# Patient Record
Sex: Male | Born: 1960 | Race: White | Hispanic: No | Marital: Married | Smoking: Former smoker
Health system: Southern US, Community
[De-identification: ages and names within clinical notes are randomized; demographics above are authoritative.]

## PROBLEM LIST (undated history)

## (undated) DIAGNOSIS — Z87828 Personal history of other (healed) physical injury and trauma: Secondary | ICD-10-CM

## (undated) DIAGNOSIS — K589 Irritable bowel syndrome without diarrhea: Secondary | ICD-10-CM

## (undated) DIAGNOSIS — K635 Polyp of colon: Secondary | ICD-10-CM

## (undated) DIAGNOSIS — K2270 Barrett's esophagus without dysplasia: Secondary | ICD-10-CM

## (undated) HISTORY — PX: ANTERIOR CRUCIATE LIGAMENT REPAIR: SHX115

## (undated) HISTORY — PX: VASECTOMY: SHX75

## (undated) HISTORY — PX: FRACTURE SURGERY: SHX138

## (undated) HISTORY — PX: CERVICAL DISCECTOMY: SHX98

## (undated) HISTORY — DX: Personal history of other (healed) physical injury and trauma: Z87.828

## (undated) HISTORY — DX: Barrett's esophagus without dysplasia: K22.70

## (undated) HISTORY — PX: SHOULDER SURGERY: SHX246

## (undated) HISTORY — DX: Polyp of colon: K63.5

## (undated) HISTORY — DX: Irritable bowel syndrome, unspecified: K58.9

---

## 1996-05-24 DIAGNOSIS — Z87828 Personal history of other (healed) physical injury and trauma: Secondary | ICD-10-CM

## 1996-05-24 HISTORY — DX: Personal history of other (healed) physical injury and trauma: Z87.828

## 2010-03-25 ENCOUNTER — Emergency Department (HOSPITAL_BASED_OUTPATIENT_CLINIC_OR_DEPARTMENT_OTHER): Admission: EM | Admit: 2010-03-25 | Discharge: 2010-03-25 | Payer: Self-pay | Admitting: Emergency Medicine

## 2010-03-25 ENCOUNTER — Ambulatory Visit: Payer: Self-pay | Admitting: Radiology

## 2010-08-04 LAB — POCT CARDIAC MARKERS

## 2010-08-04 LAB — CBC
MCH: 31.6 pg (ref 26.0–34.0)
MCV: 90.6 fL (ref 78.0–100.0)
Platelets: 253 10*3/uL (ref 150–400)
RDW: 11.4 % — ABNORMAL LOW (ref 11.5–15.5)

## 2010-08-04 LAB — BASIC METABOLIC PANEL
BUN: 19 mg/dL (ref 6–23)
CO2: 23 mEq/L (ref 19–32)
Chloride: 105 mEq/L (ref 96–112)
Creatinine, Ser: 1.2 mg/dL (ref 0.4–1.5)

## 2010-08-04 LAB — DIFFERENTIAL
Basophils Absolute: 0.1 10*3/uL (ref 0.0–0.1)
Basophils Relative: 1 % (ref 0–1)
Eosinophils Absolute: 0.1 10*3/uL (ref 0.0–0.7)
Eosinophils Relative: 2 % (ref 0–5)

## 2011-06-16 ENCOUNTER — Encounter: Payer: Self-pay | Admitting: Family Medicine

## 2011-06-16 DIAGNOSIS — K635 Polyp of colon: Secondary | ICD-10-CM | POA: Insufficient documentation

## 2011-06-16 DIAGNOSIS — K2270 Barrett's esophagus without dysplasia: Secondary | ICD-10-CM | POA: Insufficient documentation

## 2011-06-16 DIAGNOSIS — K589 Irritable bowel syndrome without diarrhea: Secondary | ICD-10-CM | POA: Insufficient documentation

## 2011-06-23 ENCOUNTER — Encounter: Payer: BC Managed Care – PPO | Admitting: Physician Assistant

## 2011-07-14 ENCOUNTER — Encounter: Payer: BC Managed Care – PPO | Admitting: Physician Assistant

## 2011-07-28 ENCOUNTER — Ambulatory Visit (INDEPENDENT_AMBULATORY_CARE_PROVIDER_SITE_OTHER): Payer: BC Managed Care – PPO | Admitting: Physician Assistant

## 2011-07-28 ENCOUNTER — Encounter: Payer: Self-pay | Admitting: Physician Assistant

## 2011-07-28 DIAGNOSIS — R202 Paresthesia of skin: Secondary | ICD-10-CM

## 2011-07-28 DIAGNOSIS — R635 Abnormal weight gain: Secondary | ICD-10-CM

## 2011-07-28 DIAGNOSIS — R5383 Other fatigue: Secondary | ICD-10-CM

## 2011-07-28 DIAGNOSIS — Z0000 Encounter for general adult medical examination without abnormal findings: Secondary | ICD-10-CM

## 2011-07-28 DIAGNOSIS — R209 Unspecified disturbances of skin sensation: Secondary | ICD-10-CM

## 2011-07-28 LAB — POCT URINALYSIS DIPSTICK
Bilirubin, UA: NEGATIVE
Blood, UA: NEGATIVE
Glucose, UA: NEGATIVE
Ketones, UA: NEGATIVE
Spec Grav, UA: 1.02

## 2011-07-28 LAB — CBC WITH DIFFERENTIAL/PLATELET
Basophils Absolute: 0 10*3/uL (ref 0.0–0.1)
Basophils Relative: 1 % (ref 0–1)
Hemoglobin: 14.7 g/dL (ref 13.0–17.0)
MCHC: 34.4 g/dL (ref 30.0–36.0)
Monocytes Relative: 14 % — ABNORMAL HIGH (ref 3–12)
Neutro Abs: 3 10*3/uL (ref 1.7–7.7)
Neutrophils Relative %: 58 % (ref 43–77)
Platelets: 270 10*3/uL (ref 150–400)

## 2011-07-28 MED ORDER — OMEPRAZOLE 20 MG PO CPDR: 20.0000 mg | DELAYED_RELEASE_CAPSULE | Freq: Every day | ORAL | Status: DC

## 2011-07-28 MED ORDER — GLYCOPYRROLATE 2 MG PO TABS: 2.0000 mg | ORAL_TABLET | Freq: Two times a day (BID) | ORAL | Status: DC

## 2011-07-28 NOTE — Progress Notes (Signed)
  Subjective:    Patient ID: Joshua Moses, male    DOB: [birth year], 51 y.o.   MRN: [id number]  HPI  See scanned in PE form.  Only c/o is fatigue, weight gain (weight is unchanged on our scales since Oct 2011).  Also has daily paresthesia over his L lateral quadricep area and also pain.  Present X 6 months.  [place]  Review of Systems  All other systems reviewed and are negative.       Objective:   Physical Exam  Vitals reviewed. Constitutional: He is oriented to person, place, and time. He appears well-developed and well-nourished.  HENT:  Head: Normocephalic and atraumatic.  Right Ear: External ear normal.  Left Ear: External ear normal.  Mouth/Throat: Oropharynx is clear and moist.  Eyes: Conjunctivae and EOM are normal. Pupils are equal, round, and reactive to light.  Neck: Normal range of motion. No tracheal deviation present. No thyromegaly present.  Cardiovascular: Normal rate, regular rhythm, normal heart sounds and intact distal pulses.  Exam reveals no gallop and no friction rub.   No murmur heard. Pulmonary/Chest: Effort normal and breath sounds normal.  Abdominal: Bowel sounds are normal. He exhibits no distension and no mass. There is no tenderness. There is no rebound and no guarding.  Genitourinary: Rectum normal, prostate normal and penis normal. Guaiac negative stool. No penile tenderness.  Musculoskeletal: Normal range of motion. He exhibits tenderness (point tender over distal aspect of IT band L leg). He exhibits no edema.  Lymphadenopathy:    He has no cervical adenopathy.  Neurological: He is alert and oriented to person, place, and time. He has normal reflexes. He displays normal reflexes. No cranial nerve deficit. He exhibits normal muscle tone. Coordination normal.  Skin: Skin is warm and dry.  Psychiatric: He has a normal mood and affect.    Results for orders placed in visit on 07/28/11  IFOBT (OCCULT BLOOD)      Component Value Range   IFOBT Negative    POCT URINALYSIS DIPSTICK      Component Value Range   Color, UA yellow     Clarity, UA clear     Glucose, UA neg     Bilirubin, UA neg     Ketones, UA neg     Spec Grav, UA 1.020     Blood, UA neg     pH, UA 5.5     Protein, UA neg     Urobilinogen, UA 0.2     Nitrite, UA neg     Leukocytes, UA Negative             Assessment & Plan:  Fatigue/subjective weight gain (seems more consistent with redistribution of same weight to me).  Check labs.  Refill meds.

## 2011-07-28 NOTE — Patient Instructions (Signed)
Cut down on alcohol, late night snacks.  Increase exercise.  Illiotibial band exercises and handout.

## 2011-07-29 LAB — TESTOSTERONE, FREE, TOTAL, SHBG
Testosterone, Free: 96 pg/mL (ref 47.0–244.0)
Testosterone-% Free: 1.7 % (ref 1.6–2.9)

## 2011-07-29 LAB — COMPREHENSIVE METABOLIC PANEL
AST: 26 U/L (ref 0–37)
Albumin: 4.3 g/dL (ref 3.5–5.2)
BUN: 14 mg/dL (ref 6–23)
Calcium: 9.6 mg/dL (ref 8.4–10.5)
Chloride: 105 mEq/L (ref 96–112)
Glucose, Bld: 105 mg/dL — ABNORMAL HIGH (ref 70–99)
Potassium: 4 mEq/L (ref 3.5–5.3)

## 2011-07-29 LAB — LIPID PANEL
Cholesterol: 210 mg/dL — ABNORMAL HIGH (ref 0–200)
LDL Cholesterol: 146 mg/dL — ABNORMAL HIGH (ref 0–99)
Triglycerides: 65 mg/dL (ref <150)

## 2011-07-30 LAB — VITAMIN D 1,25 DIHYDROXY
Vitamin D 1, 25 (OH)2 Total: 32 pg/mL (ref 18–72)
Vitamin D3 1, 25 (OH)2: 32 pg/mL

## 2011-09-27 ENCOUNTER — Other Ambulatory Visit: Payer: Self-pay | Admitting: Physician Assistant

## 2011-09-28 ENCOUNTER — Telehealth: Payer: Self-pay

## 2011-09-28 NOTE — Telephone Encounter (Signed)
.  [facility] The patient's wife called regarding the Rx for glycopyrrolate 2mg .  The patient's wife stated that she had called two weeks ago and the medication had not been called into pharmacy.  The patient's wife stated that the pharmacy re-faxed the request yesterday and Mal is out of his medication completely.  Please call [phone].

## 2011-09-29 NOTE — Telephone Encounter (Signed)
LMOM THAT THEY SHOULD HAVE 6 REFILLS ON RX AT [facility] THAT WAS SENT IN ON THE 07/28/11

## 2011-10-13 ENCOUNTER — Ambulatory Visit (INDEPENDENT_AMBULATORY_CARE_PROVIDER_SITE_OTHER): Payer: BC Managed Care – PPO | Admitting: Family Medicine

## 2011-10-13 ENCOUNTER — Ambulatory Visit: Payer: BC Managed Care – PPO

## 2011-10-13 VITALS — BP 121/86 | HR 55 | Temp 97.9°F | Resp 16 | Ht 63.0 in | Wt 158.2 lb

## 2011-10-13 DIAGNOSIS — M25569 Pain in unspecified knee: Secondary | ICD-10-CM

## 2011-10-13 DIAGNOSIS — IMO0002 Reserved for concepts with insufficient information to code with codable children: Secondary | ICD-10-CM

## 2011-10-13 MED ORDER — MELOXICAM 15 MG PO TABS: 15.0000 mg | ORAL_TABLET | Freq: Every day | ORAL | Status: AC

## 2011-10-13 NOTE — Progress Notes (Signed)
  Subjective:    Patient ID: Joshua Moses, male    DOB: 10-Feb-1961, 51 y.o.   MRN: [id number]  HPI See last OV.  He was having some trouble with his Left lower leg/knee which seemed to be at the IT-band.  It seems worse the last 3 days or so and intermittently before that.  Difficult to bear weight on it and especially painful with any movement that causes torque forces on it.  He did mis-step this weekend when stepping off a stool but it didn't begin to hurt immediately after that.  He has a difficult time explaining or showing me exactly where it is hurting.  Describes it as a deep ache, often behind the kneecap, definitely worsened with any flexion or trying to stoop or turn; in those cases, it feels as though it is going to give way.  Review of Systems  All other systems reviewed and are negative.       Objective:   Physical Exam  Nursing note and vitals reviewed. Constitutional: He appears well-developed and well-nourished.  Musculoskeletal:       Left knee: He exhibits decreased range of motion (extension is normal, flexion is limited by 50%). He exhibits no swelling, no effusion, no ecchymosis, no deformity, no laceration, no erythema, normal alignment, no LCL laxity, normal patellar mobility, no bony tenderness, normal meniscus and no MCL laxity. no tenderness found. No medial joint line, no lateral joint line, no MCL, no LCL and no patellar tendon tenderness noted.   [facility] reading (PRIMARY) by  Dr. Audria Nine as NAD except mild degenerative changes.     Assessment & Plan:  L knee pain-Strain and arthritic changes.  If not well in 2 weeks will refer.  Use stretches given at last ov.  Relative rest.

## 2013-02-14 ENCOUNTER — Encounter: Payer: Self-pay | Admitting: Family Medicine

## 2013-02-14 ENCOUNTER — Ambulatory Visit (INDEPENDENT_AMBULATORY_CARE_PROVIDER_SITE_OTHER): Payer: BC Managed Care – PPO | Admitting: Family Medicine

## 2013-02-14 VITALS — BP 120/80 | HR 48 | Temp 97.8°F | Resp 16 | Ht 63.0 in | Wt 154.0 lb

## 2013-02-14 DIAGNOSIS — G8929 Other chronic pain: Secondary | ICD-10-CM

## 2013-02-14 DIAGNOSIS — M542 Cervicalgia: Secondary | ICD-10-CM

## 2013-02-14 DIAGNOSIS — M722 Plantar fascial fibromatosis: Secondary | ICD-10-CM

## 2013-02-14 DIAGNOSIS — Z1211 Encounter for screening for malignant neoplasm of colon: Secondary | ICD-10-CM

## 2013-02-14 MED ORDER — MELOXICAM 15 MG PO TABS: 15.0000 mg | ORAL_TABLET | Freq: Every day | ORAL | Status: DC

## 2013-02-14 NOTE — Patient Instructions (Addendum)
Ankle pain- I suspect you have plantar fasciitis. I have included some information about this condition. Meloxicam will help as well as ankle stretching exercises first thing in the morning before you get up. I have included some information about foods that have anti-inflammatory properties. If this does not improve, an xray of your foot may reveal a heel spur; foot specialist (podiatrist) can diagnose and treat this specific condition if it does not improve.  FLAX SEED OIL capsule- 1 gram capsules; take 1-2 capsules daily. Take this separate from other solid tablets and capsules.    Plantar Fasciitis Plantar fasciitis is a common condition that causes foot pain. It is soreness (inflammation) of the band of tough fibrous tissue on the bottom of the foot that runs from the heel bone (calcaneus) to the ball of the foot. The cause of this soreness may be from excessive standing, poor fitting shoes, running on hard surfaces, being overweight, having an abnormal walk, or overuse (this is common in runners) of the painful foot or feet. It is also common in aerobic exercise dancers and ballet dancers. SYMPTOMS  Most people with plantar fasciitis complain of:  Severe pain in the morning on the bottom of their foot especially when taking the first steps out of bed. This pain recedes after a few minutes of walking.  Severe pain is experienced also during walking following a long period of inactivity.  Pain is worse when walking barefoot or up stairs DIAGNOSIS   Your caregiver will diagnose this condition by examining and feeling your foot.  Special tests such as X-rays of your foot, are usually not needed. PREVENTION   Consult a sports medicine professional before beginning a new exercise program.  Walking programs offer a good workout. With walking there is a lower chance of overuse injuries common to runners. There is less impact and less jarring of the joints.  Begin all new exercise programs  slowly. If problems or pain develop, decrease the amount of time or distance until you are at a comfortable level.  Wear good shoes and replace them regularly.  Stretch your foot and the heel cords at the back of the ankle (Achilles tendon) both before and after exercise.  Run or exercise on even surfaces that are not hard. For example, asphalt is better than pavement.  Do not run barefoot on hard surfaces.  If using a treadmill, vary the incline.  Do not continue to workout if you have foot or joint problems. Seek professional help if they do not improve. HOME CARE INSTRUCTIONS   Avoid activities that cause you pain until you recover.  Use ice or cold packs on the problem or painful areas after working out.  Only take over-the-counter or prescription medicines for pain, discomfort, or fever as directed by your caregiver.  Soft shoe inserts or athletic shoes with air or gel sole cushions may be helpful.  If problems continue or become more severe, consult a sports medicine caregiver or your own health care provider. Cortisone is a potent anti-inflammatory medication that may be injected into the painful area. You can discuss this treatment with your caregiver. MAKE SURE YOU:   Understand these instructions.  Will watch your condition.  Will get help right away if you are not doing well or get worse. Document Released: 02/02/2001 Document Revised: 08/02/2011 Document Reviewed: 04/03/2008 [facility] Patient Information 2014 [place], [place].

## 2013-02-14 NOTE — Progress Notes (Signed)
S:  This 52 y.o. Cauc male has chronic joint pain and C-spine DDD. The latter is treated by a chiropractor; last visit was ~ 1 year ago. Joint pain increases with weather changes and excessive physical activity. Meloxicam has been very effective in the past. New sudden onset of R heel pain, worse in mornings and better as day progresses. He has chronic L heel pain d/t fracture x 3. Pt walks on concrete floors at work and wears walking shoes; has tried different inserts but nothing alleviates pain. Does not take time to stretch in bed before getting up. He had low Vit D level last year and is taking a supplement.  Pt denies fever/chills, fatigue, rashes, swollen joints, muscle weakness or abnormal weight loss.  Pt has chronic neck pain w/ known C-spine DDD. He sees a chiropractor prn (not recently). He has crepitus in neck w/ rotation. He denies numbness or weakness in arms but has increased discomfort when rasing arms above shoulder level or w/ prolonged use of arms.  Pt also has hx of "colitis" and had endoscopy/colonoscopy years ago; he is overdue for this procedure. He has no change in stools, n/v, hematochezia or abnormal weight loss. He notes lactose intolerance but does drink almond milk.   Patient Active Problem List   Diagnosis Date Noted  . IBS (irritable bowel syndrome)   . Colon polyps   . Esophagus, Barrett's    PMHx, Soc Hx and Fam Hx reviewed. Mother had Degenerative joint Dz but not sure if she had Osteoporosis.  ROS: As per HPI; otherwise noncontributory.  O: Filed Vitals:   02/14/13 1027  BP: 120/80  Pulse: 48  Temp: 97.8 F (36.6 C)  Resp: 16   GEN: In NAD; WN,WD. HENT: [place]/AT; EOMI w/ clear conj/sclerae. Wears corrective lenses. EACs/nose/oroph unremarkable. COR: RRR. LUNGS: Normal resp rate and effort. BACK: C-spine w/ decreased ROM w/ rotation and flexion/ extension. NT w/ palpation. SKIN: W&D; intact. No erythema, rashes, ecchymoses or lesions. MS: MAEs; no c/c/e.  R heel tender to touch but otherwise normal appearance. NEURO: A&O x 3; CNs intact. Sensory/ motor function normal. DTRs 2+/=. Nonfocal.  A/P: Plantar fasciitis- Heel stretches and continue to wear appropriate footwear. Consider podiatry evaluation.  Neck pain, chronic- Resume Meloxicam; try moist heat and topical analgesic. Advised pt about anti-inflammatory nutrition. Pt has generalized aches and pains- consider testing for Hep C antibody.  Screening for colorectal cancer - Plan: Ambulatory referral to Gastroenterology  Meds ordered this encounter  Medications  . DISCONTD: meloxicam (MOBIC) 15 MG tablet    Sig: Take 15 mg by mouth daily.  . meloxicam (MOBIC) 15 MG tablet    Sig: Take 1 tablet (15 mg total) by mouth daily.    Dispense:  30 tablet    Refill:  5

## 2013-02-15 ENCOUNTER — Encounter: Payer: Self-pay | Admitting: Family Medicine

## 2013-02-15 DIAGNOSIS — G8929 Other chronic pain: Secondary | ICD-10-CM | POA: Insufficient documentation

## 2013-08-14 ENCOUNTER — Encounter: Payer: Self-pay | Admitting: Family Medicine

## 2013-08-14 ENCOUNTER — Ambulatory Visit (INDEPENDENT_AMBULATORY_CARE_PROVIDER_SITE_OTHER): Payer: BC Managed Care – PPO | Admitting: Family Medicine

## 2013-08-14 VITALS — BP 140/82 | HR 54 | Temp 98.8°F | Resp 16 | Ht 63.0 in | Wt 164.4 lb

## 2013-08-14 DIAGNOSIS — R635 Abnormal weight gain: Secondary | ICD-10-CM

## 2013-08-14 DIAGNOSIS — Z8639 Personal history of other endocrine, nutritional and metabolic disease: Secondary | ICD-10-CM

## 2013-08-14 DIAGNOSIS — Z0000 Encounter for general adult medical examination without abnormal findings: Secondary | ICD-10-CM

## 2013-08-14 DIAGNOSIS — M25579 Pain in unspecified ankle and joints of unspecified foot: Secondary | ICD-10-CM | POA: Insufficient documentation

## 2013-08-14 LAB — IFOBT (OCCULT BLOOD): IFOBT: NEGATIVE

## 2013-08-14 MED ORDER — ACETAMINOPHEN-CODEINE #2 300-15 MG PO TABS: 1.0000 | ORAL_TABLET | ORAL | Status: DC | PRN

## 2013-08-14 MED ORDER — MELOXICAM 15 MG PO TABS: 15.0000 mg | ORAL_TABLET | Freq: Every day | ORAL | Status: DC

## 2013-08-14 MED ORDER — OMEPRAZOLE 20 MG PO CPDR: 20.0000 mg | DELAYED_RELEASE_CAPSULE | Freq: Every day | ORAL | Status: DC

## 2013-08-14 NOTE — Patient Instructions (Addendum)
Keeping you healthy  Get these tests  Blood pressure- Have your blood pressure checked once a year by your healthcare provider.  Normal blood pressure is 120/80  Weight- Have your body mass index (BMI) calculated to screen for obesity.  BMI is a measure of body fat based on height and weight. You can also calculate your own BMI at [email or web address].  Cholesterol- Have your cholesterol checked every year.  Diabetes- Have your blood sugar checked regularly if you have high blood pressure, high cholesterol, have a family history of diabetes or if you are overweight.  Screening for Colon Cancer- Colonoscopy starting at age 55.  Screening may begin sooner depending on your family history and other health conditions. Follow up colonoscopy as directed by your Gastroenterologist.  Screening for Prostate Cancer- Both blood work (PSA) and a rectal exam help screen for Prostate Cancer.  Screening begins at age 55 with African-American men and at age 55 with Caucasian men.  Screening may begin sooner depending on your family history.  Take these medicines  Aspirin- One aspirin daily can help prevent Heart disease and Stroke.  Flu shot- Every fall.  Tetanus- Every 10 years. Tdap was given on 04/30/2009. Next Tetanus due in 2020.  Zostavax- Once after the age of 55 to prevent Shingles.  Pneumonia shot- Once after the age of 55; if you are younger than 55, ask your healthcare provider if you need a Pneumonia shot.  Take these steps  Don't smoke- If you do smoke, talk to your doctor about quitting.  For tips on how to quit, go to www.smokefree.gov or call 1-800-QUIT-NOW.  Be physically active- Exercise 5 days a week for at least 30 minutes.  If you are not already physically active start slow and gradually work up to 30 minutes of moderate physical activity.  Examples of moderate activity include walking briskly, mowing the yard, dancing, swimming, bicycling, etc.  Eat a healthy diet- Eat a  variety of healthy food such as fruits, vegetables, low fat milk, low fat cheese, yogurt, lean meant, poultry, fish, beans, tofu, etc. For more information go to www.thenutritionsource.org  Drink alcohol in moderation- Limit alcohol intake to less than two drinks a day. Never drink and drive.  Dentist- Brush and floss twice daily; visit your dentist twice a year.  Depression- Your emotional health is as important as your physical health. If you're feeling down, or losing interest in things you would normally enjoy please talk to your healthcare provider.  Eye exam- Visit your eye doctor every year.  Safe sex- If you may be exposed to a sexually transmitted infection, use a condom.  Seat belts- Seat belts can save your life; always wear one.  Smoke/Carbon Monoxide detectors- These detectors need to be installed on the appropriate level of your home.  Replace batteries at least once a year.  Skin cancer- When out in the sun, cover up and use sunscreen 15 SPF or higher.  Violence- If anyone is threatening you, please tell your healthcare provider.  Living Will/ Health care power of attorney- Speak with your healthcare provider and family.  I have placed an order for referral to Podiatry. The specialist will evaluate your problem do xrays and prescribe treatment.    Gastroesophageal Reflux Disease, Adult Gastroesophageal reflux disease (GERD) happens when acid from your stomach flows up into the esophagus. When acid comes in contact with the esophagus, the acid causes soreness (inflammation) in the esophagus. Over time, GERD may create small holes (ulcers) in the  lining of the esophagus. CAUSES   Increased body weight. This puts pressure on the stomach, making acid rise from the stomach into the esophagus.  Smoking. This increases acid production in the stomach.  Drinking alcohol. This causes decreased pressure in the lower esophageal sphincter (valve or ring of muscle between the  esophagus and stomach), allowing acid from the stomach into the esophagus.  Late evening meals and a full stomach. This increases pressure and acid production in the stomach.  A malformed lower esophageal sphincter. Sometimes, no cause is found. SYMPTOMS   Burning pain in the lower part of the mid-chest behind the breastbone and in the mid-stomach area. This may occur twice a week or more often.  Trouble swallowing.  Sore throat.  Dry cough.  Asthma-like symptoms including chest tightness, shortness of breath, or wheezing. DIAGNOSIS  Your caregiver may be able to diagnose GERD based on your symptoms. In some cases, X-rays and other tests may be done to check for complications or to check the condition of your stomach and esophagus. TREATMENT  Your caregiver may recommend over-the-counter or prescription medicines to help decrease acid production. Ask your caregiver before starting or adding any new medicines.  HOME CARE INSTRUCTIONS   Change the factors that you can control. Ask your caregiver for guidance concerning weight loss, quitting smoking, and alcohol consumption.  Avoid foods and drinks that make your symptoms worse, such as:  Caffeine or alcoholic drinks.  Chocolate.  Peppermint or mint flavorings.  Garlic and onions.  Spicy foods.  Citrus fruits, such as oranges, lemons, or limes.  Tomato-based foods such as sauce, chili, salsa, and pizza.  Fried and fatty foods.  Avoid lying down for the 3 hours prior to your bedtime or prior to taking a nap.  Eat small, frequent meals instead of large meals.  Wear loose-fitting clothing. Do not wear anything tight around your waist that causes pressure on your stomach.  Raise the head of your bed 6 to 8 inches with wood blocks to help you sleep. Extra pillows will not help.  Only take over-the-counter or prescription medicines for pain, discomfort, or fever as directed by your caregiver.  Do not take aspirin,  ibuprofen, or other nonsteroidal anti-inflammatory drugs (NSAIDs). SEEK IMMEDIATE MEDICAL CARE IF:   You have pain in your arms, neck, jaw, teeth, or back.  Your pain increases or changes in intensity or duration.  You develop nausea, vomiting, or sweating (diaphoresis).  You develop shortness of breath, or you faint.  Your vomit is green, yellow, black, or looks like coffee grounds or blood.  Your stool is red, bloody, or black. These symptoms could be signs of other problems, such as heart disease, gastric bleeding, or esophageal bleeding. MAKE SURE YOU:   Understand these instructions.  Will watch your condition.  Will get help right away if you are not doing well or get worse. Document Released: 02/17/2005 Document Revised: 08/02/2011 Document Reviewed: 11/27/2010 [facility] Patient Information 2014 [place], [place].

## 2013-08-14 NOTE — Progress Notes (Signed)
Subjective:    Patient ID: Joshua Moses, male    DOB: November 14, 1960, 53 y.o.   MRN: [id number]  HPI  This 53 y.o. Cauc male is here for CPE; His main complaints are chronic daily ankle and foot pain and fatigue w/ weight gain. Pain not responding to changed footwear. Pt takes one of wife's APAP-Codeine #3; relief w/ 1/3- 1/2 tablet. Does have hx of L ankle fracture in remote past. Denies swelling or discoloration w/ pain.  HCM: CRS- GI eval in jan 2015.           IMM- Current.           Vision- Biannually.  Patient Active Problem List   Diagnosis Date Noted  . Pain in joint, ankle and foot 08/14/2013  . Neck pain, chronic 02/15/2013  . IBS (irritable bowel syndrome)   . Colon polyps   . Esophagus, Barrett's    Prior to Admission medications   Medication Sig Start Date End Date Taking? Authorizing Provider  meloxicam (MOBIC) 15 MG tablet Take 1 tablet (15 mg total) by mouth daily.   Yes Maurice MarchBarbara B Britteny Fiebelkorn, MD          PMHx, Surg Hx, Soc and Fam Hx reviewed.  Review of Systems  Constitutional: Positive for unexpected weight change.  Eyes: Positive for visual disturbance.  Musculoskeletal:       Joint and muscle pain      Objective:   Physical Exam  Nursing note and vitals reviewed. Constitutional: He is oriented to person, place, and time. Vital signs are normal. He appears well-developed and well-nourished.  HENT:  Head: Normocephalic and atraumatic.  Right Ear: Hearing, tympanic membrane, external ear and ear canal normal.  Left Ear: Hearing, tympanic membrane, external ear and ear canal normal.  Nose: Nose normal. No nasal deformity or septal deviation.  Mouth/Throat: Uvula is midline, oropharynx is clear and moist and mucous membranes are normal. No oral lesions. Normal dentition.  Eyes: Conjunctivae, EOM and lids are normal. Pupils are equal, round, and reactive to light. No scleral icterus.  Fundoscopic exam:      The right eye shows no papilledema. The right eye shows  red reflex.       The left eye shows no papilledema. The left eye shows red reflex.  Recent vision evaluation- no significant abnormalities.  Neck: Trachea normal, normal range of motion and full passive range of motion without pain. Neck supple. No JVD present. No spinous process tenderness and no muscular tenderness present. Carotid bruit is not present. No mass and no thyromegaly present.  Cardiovascular: Normal rate, regular rhythm, S1 normal, S2 normal, normal heart sounds, intact distal pulses and normal pulses.   No extrasystoles are present. PMI is not displaced.  Exam reveals no gallop and no friction rub.   No murmur heard. Pulmonary/Chest: Effort normal and breath sounds normal. No respiratory distress.  Abdominal: Soft. Normal appearance and bowel sounds are normal. He exhibits no distension, no abdominal bruit, no pulsatile midline mass and no mass. There is no hepatosplenomegaly. There is no tenderness. There is no guarding and no CVA tenderness. No hernia.  Genitourinary: Rectum normal and prostate normal. Rectal exam shows no external hemorrhoid, no fissure, no mass, no tenderness and anal tone normal. Guaiac negative stool.  Musculoskeletal:       Right ankle: He exhibits decreased range of motion. He exhibits no swelling and no deformity. Tenderness. Achilles tendon normal.       Left ankle: He exhibits  decreased range of motion and deformity. He exhibits no swelling. Tenderness. Achilles tendon exhibits pain and defect.       Cervical back: Normal.       Thoracic back: Normal.       Lumbar back: Normal.       Right foot: He exhibits tenderness. He exhibits normal range of motion, no swelling, no crepitus and no deformity.       Left foot: He exhibits decreased range of motion and tenderness. He exhibits no bony tenderness, no swelling and no crepitus.       Feet:  Lymphadenopathy:       Head (right side): No submental, no submandibular, no tonsillar, no preauricular, no  posterior auricular and no occipital adenopathy present.       Head (left side): No submental, no submandibular, no tonsillar, no preauricular and no posterior auricular adenopathy present.    He has no cervical adenopathy.       Right: No inguinal and no supraclavicular adenopathy present.       Left: No inguinal and no supraclavicular adenopathy present.  Neurological: He is alert and oriented to person, place, and time. He has normal strength and normal reflexes. He displays no atrophy. No cranial nerve deficit or sensory deficit. He exhibits normal muscle tone. He displays a negative Romberg sign. Coordination and gait normal.  Skin: Skin is warm, dry and intact. No bruising, no lesion and no rash noted. He is not diaphoretic. No cyanosis or erythema. No pallor. Nails show no clubbing.  Psychiatric: His speech is normal and behavior is normal. Judgment and thought content normal. His mood appears anxious. His affect is not angry and not inappropriate. Cognition and memory are normal. He does not exhibit a depressed mood.    Results for orders placed in visit on 08/14/13  IFOBT (OCCULT BLOOD)      Result Value Ref Range   IFOBT Negative     ECG:  NSR; Nonspecific inferior T wave changes. No ectopy.     Assessment & Plan:  Routine general medical examination at a health care facility - Plan: IFOBT POC (occult bld, rslt in office), COMPLETE METABOLIC PANEL WITH GFR, Lipid panel, PSA, Hepatitis C antibody, Vitamin D, 25-hydroxy, EKG 12-Lead, Thyroid Panel With TSH  Pain in joint, ankle and foot - Plan: Ambulatory referral to Podiatry  Weight gain- Encouraged TLCs (heart healthy diet and staying physically active).  History of vitamin D deficiency.  Meds ordered this encounter  Medications  . DISCONTD: meloxicam (MOBIC) 15 MG tablet    Sig: Take 1 tablet (15 mg total) by mouth daily.    Dispense:  30 tablet    Refill:  5  . DISCONTD: omeprazole (PRILOSEC) 20 MG capsule    Sig: Take 1  capsule (20 mg total) by mouth daily.    Dispense:  30 capsule    Refill:  5  . acetaminophen-codeine (TYLENOL #2) 300-15 MG per tablet    Sig: Take 1 tablet by mouth every 4 (four) hours as needed for moderate pain.    Dispense:  30 tablet    Refill:  0  . meloxicam (MOBIC) 15 MG tablet    Sig: Take 1 tablet (15 mg total) by mouth daily.    Dispense:  90 tablet    Refill:  1

## 2013-08-15 LAB — LIPID PANEL
CHOLESTEROL: 191 mg/dL (ref 0–200)
HDL: 57 mg/dL (ref >39)
LDL Cholesterol: 120 mg/dL — ABNORMAL HIGH (ref 0–99)
TRIGLYCERIDES: 69 mg/dL (ref <150)
Total CHOL/HDL Ratio: 3.4 Ratio
VLDL: 14 mg/dL (ref 0–40)

## 2013-08-15 LAB — COMPLETE METABOLIC PANEL WITH GFR
ALK PHOS: 67 U/L (ref 39–117)
ALT: 24 U/L (ref 0–53)
AST: 24 U/L (ref 0–37)
Albumin: 4.2 g/dL (ref 3.5–5.2)
BILIRUBIN TOTAL: 0.6 mg/dL (ref 0.2–1.2)
BUN: 11 mg/dL (ref 6–23)
CALCIUM: 9.3 mg/dL (ref 8.4–10.5)
CHLORIDE: 106 meq/L (ref 96–112)
CO2: 25 mEq/L (ref 19–32)
CREATININE: 0.99 mg/dL (ref 0.50–1.35)
GFR, Est African American: >89 mL/min
GFR, Est Non African American: 86 mL/min
Glucose, Bld: 99 mg/dL (ref 70–99)
Potassium: 3.9 mEq/L (ref 3.5–5.3)
Sodium: 137 mEq/L (ref 135–145)
Total Protein: 6.8 g/dL (ref 6.0–8.3)

## 2013-08-15 LAB — PSA: PSA: 1.25 ng/mL (ref <=4.00)

## 2013-08-15 LAB — THYROID PANEL WITH TSH
FREE THYROXINE INDEX: 3.1 (ref 1.0–3.9)
T3 Uptake: 40.6 % — ABNORMAL HIGH (ref 22.5–37.0)
T4, Total: 7.6 ug/dL (ref 5.0–12.5)
TSH: 1.327 u[IU]/mL (ref 0.350–4.500)

## 2013-08-15 LAB — VITAMIN D 25 HYDROXY (VIT D DEFICIENCY, FRACTURES): VIT D 25 HYDROXY: 31 ng/mL (ref 30–89)

## 2013-08-15 LAB — HEPATITIS C ANTIBODY: HCV Ab: NEGATIVE

## 2013-08-15 NOTE — Progress Notes (Signed)
Quick Note:  Please advise pt regarding following labs...  All labs look good except LDL ("bad") cholesterol is a little above normal. Try to eat as healthy as possible, using the guidelines I gave you. PSA (prostate blood test) is normal. Test for Hepatitis C is negative. Thyroid tests are normal.  Vitamin D is a little below normal. Get an over-the-counter supplement Vitamin D3 2000 units and take this supplement daily. Try to get some sun exposure for 10-15 minutes most days of the week so your body can use the Vitamin D. Foods that are rich in Vitamin D include: salmon, tuna and mackerel, mushrooms, eggs and some dairy products.  Copy to pt. ______

## 2013-08-16 ENCOUNTER — Encounter: Payer: BC Managed Care – PPO | Admitting: Family Medicine

## 2013-09-06 ENCOUNTER — Ambulatory Visit (INDEPENDENT_AMBULATORY_CARE_PROVIDER_SITE_OTHER): Payer: BC Managed Care – PPO | Admitting: Podiatry

## 2013-09-06 ENCOUNTER — Encounter: Payer: Self-pay | Admitting: Podiatry

## 2013-09-06 ENCOUNTER — Ambulatory Visit (INDEPENDENT_AMBULATORY_CARE_PROVIDER_SITE_OTHER): Payer: BC Managed Care – PPO

## 2013-09-06 DIAGNOSIS — M722 Plantar fascial fibromatosis: Secondary | ICD-10-CM

## 2013-09-06 DIAGNOSIS — R52 Pain, unspecified: Secondary | ICD-10-CM

## 2013-09-06 DIAGNOSIS — M7660 Achilles tendinitis, unspecified leg: Secondary | ICD-10-CM

## 2013-09-06 MED ORDER — TRIAMCINOLONE ACETONIDE 10 MG/ML IJ SUSP
10.0000 mg | Freq: Once | INTRAMUSCULAR | Status: AC
  Administered 2013-09-06: 10 mg

## 2013-09-06 NOTE — Patient Instructions (Addendum)
Plantar Fasciitis (Heel Spur Syndrome) with Rehab The plantar fascia is a fibrous, ligament-like, soft-tissue structure that spans the bottom of the foot. Plantar fasciitis is a condition that causes pain in the foot due to inflammation of the tissue. SYMPTOMS   Pain and tenderness on the underneath side of the foot.  Pain that worsens with standing or walking. CAUSES  Plantar fasciitis is caused by irritation and injury to the plantar fascia on the underneath side of the foot. Common mechanisms of injury include:  Direct trauma to bottom of the foot.  Damage to a small nerve that runs under the foot where the main fascia attaches to the heel bone.  Stress placed on the plantar fascia due to bone spurs. RISK INCREASES WITH:   Activities that place stress on the plantar fascia (running, jumping, pivoting, or cutting).  Poor strength and flexibility.  Improperly fitted shoes.  Tight calf muscles.  Flat feet.  Failure to warm-up properly before activity.  Obesity. PREVENTION  Warm up and stretch properly before activity.  Allow for adequate recovery between workouts.  Maintain physical fitness:  Strength, flexibility, and endurance.  Cardiovascular fitness.  Maintain a health body weight.  Avoid stress on the plantar fascia.  Wear properly fitted shoes, including arch supports for individuals who have flat feet.  PROGNOSIS  If treated properly, then the symptoms of plantar fasciitis usually resolve without surgery. However, occasionally surgery is necessary.  RELATED COMPLICATIONS   Recurrent symptoms that may result in a chronic condition.  Problems of the lower back that are caused by compensating for the injury, such as limping.  Pain or weakness of the foot during push-off following surgery.  Chronic inflammation, scarring, and partial or complete fascia tear, occurring more often from repeated injections.  TREATMENT  Treatment initially involves the  use of ice and medication to help reduce pain and inflammation. The use of strengthening and stretching exercises may help reduce pain with activity, especially stretches of the Achilles tendon. These exercises may be performed at home or with a therapist. Your caregiver may recommend that you use heel cups of arch supports to help reduce stress on the plantar fascia. Occasionally, corticosteroid injections are given to reduce inflammation. If symptoms persist for greater than 6 months despite non-surgical (conservative), then surgery may be recommended.   MEDICATION   If pain medication is necessary, then nonsteroidal anti-inflammatory medications, such as aspirin and ibuprofen, or other minor pain relievers, such as acetaminophen, are often recommended.  Do not take pain medication within 7 days before surgery.  Prescription pain relievers may be given if deemed necessary by your caregiver. Use only as directed and only as much as you need.  Corticosteroid injections may be given by your caregiver. These injections should be reserved for the most serious cases, because they may only be given a certain number of times.  HEAT AND COLD  Cold treatment (icing) relieves pain and reduces inflammation. Cold treatment should be applied for 10 to 15 minutes every 2 to 3 hours for inflammation and pain and immediately after any activity that aggravates your symptoms. Use ice packs or massage the area with a piece of ice (ice massage).  Heat treatment may be used prior to performing the stretching and strengthening activities prescribed by your caregiver, physical therapist, or athletic trainer. Use a heat pack or soak the injury in warm water.  SEEK IMMEDIATE MEDICAL CARE IF:  Treatment seems to offer no benefit, or the condition worsens.  Any medications   produce adverse side effects.  EXERCISES- RANGE OF MOTION (ROM) AND STRETCHING EXERCISES - Plantar Fasciitis (Heel Spur Syndrome) These exercises  may help you when beginning to rehabilitate your injury. Your symptoms may resolve with or without further involvement from your physician, physical therapist or athletic trainer. While completing these exercises, remember:   Restoring tissue flexibility helps normal motion to return to the joints. This allows healthier, less painful movement and activity.  An effective stretch should be held for at least 30 seconds.  A stretch should never be painful. You should only feel a gentle lengthening or release in the stretched tissue.  RANGE OF MOTION - Toe Extension, Flexion  Sit with your right / left leg crossed over your opposite knee.  Grasp your toes and gently pull them back toward the top of your foot. You should feel a stretch on the bottom of your toes and/or foot.  Hold this stretch for 10 seconds.  Now, gently pull your toes toward the bottom of your foot. You should feel a stretch on the top of your toes and or foot.  Hold this stretch for 10 seconds. Repeat  times. Complete this stretch 3 times per day.   RANGE OF MOTION - Ankle Dorsiflexion, Active Assisted  Remove shoes and sit on a chair that is preferably not on a carpeted surface.  Place right / left foot under knee. Extend your opposite leg for support.  Keeping your heel down, slide your right / left foot back toward the chair until you feel a stretch at your ankle or calf. If you do not feel a stretch, slide your bottom forward to the edge of the chair, while still keeping your heel down.  Hold this stretch for 10 seconds. Repeat 3 times. Complete this stretch 2 times per day.   STRETCH  Gastroc, Standing  Place hands on wall.  Extend right / left leg, keeping the front knee somewhat bent.  Slightly point your toes inward on your back foot.  Keeping your right / left heel on the floor and your knee straight, shift your weight toward the wall, not allowing your back to arch.  You should feel a gentle stretch  in the right / left calf. Hold this position for 10 seconds. Repeat 3 times. Complete this stretch 2 times per day.  STRETCH  Soleus, Standing  Place hands on wall.  Extend right / left leg, keeping the other knee somewhat bent.  Slightly point your toes inward on your back foot.  Keep your right / left heel on the floor, bend your back knee, and slightly shift your weight over the back leg so that you feel a gentle stretch deep in your back calf.  Hold this position for 10 seconds. Repeat 3 times. Complete this stretch 2 times per day.  STRETCH  Gastrocsoleus, Standing  Note: This exercise can place a lot of stress on your foot and ankle. Please complete this exercise only if specifically instructed by your caregiver.   Place the ball of your right / left foot on a step, keeping your other foot firmly on the same step.  Hold on to the wall or a rail for balance.  Slowly lift your other foot, allowing your body weight to press your heel down over the edge of the step.  You should feel a stretch in your right / left calf.  Hold this position for 10 seconds.  Repeat this exercise with a slight bend in your right /   left knee. Repeat 3 times. Complete this stretch 2 times per day.   STRENGTHENING EXERCISES - Plantar Fasciitis (Heel Spur Syndrome)  These exercises may help you when beginning to rehabilitate your injury. They may resolve your symptoms with or without further involvement from your physician, physical therapist or athletic trainer. While completing these exercises, remember:   Muscles can gain both the endurance and the strength needed for everyday activities through controlled exercises.  Complete these exercises as instructed by your physician, physical therapist or athletic trainer. Progress the resistance and repetitions only as guided.  STRENGTH - Towel Curls  Sit in a chair positioned on a non-carpeted surface.  Place your foot on a towel, keeping your heel  on the floor.  Pull the towel toward your heel by only curling your toes. Keep your heel on the floor. Repeat 3 times. Complete this exercise 2 times per day.  STRENGTH - Ankle Inversion  Secure one end of a rubber exercise band/tubing to a fixed object (table, pole). Loop the other end around your foot just before your toes.  Place your fists between your knees. This will focus your strengthening at your ankle.  Slowly, pull your big toe up and in, making sure the band/tubing is positioned to resist the entire motion.  Hold this position for 10 seconds.  Have your muscles resist the band/tubing as it slowly pulls your foot back to the starting position. Repeat 3 times. Complete this exercises 2 times per day.  Document Released: 05/10/2005 Document Revised: 08/02/2011 Document Reviewed: 08/22/2008 [employer] Patient Information 2014 [employer] Achilles Tendinitis  with Rehab Achilles tendinitis is a disorder of the Achilles tendon. The Achilles tendon connects the large calf muscles (Gastrocnemius and Soleus) to the heel bone (calcaneus). This tendon is sometimes called the heel cord. It is important for pushing-off and standing on your toes and is important for walking, running, or jumping. Tendinitis is often caused by overuse and repetitive microtrauma. SYMPTOMS  Pain, tenderness, swelling, warmth, and redness may occur over the Achilles tendon even at rest.  Pain with pushing off, or flexing or extending the ankle.  Pain that is worsened after or during activity. CAUSES   Overuse sometimes seen with rapid increase in exercise programs or in sports requiring running and jumping.  Poor physical conditioning (strength and flexibility or endurance).  Running sports, especially training running down hills.  Inadequate warm-up before practice or play or failure to stretch before participation.  Injury to the tendon. PREVENTION   Warm up and stretch before practice or  competition.  Allow time for adequate rest and recovery between practices and competition.  Keep up conditioning.  Keep up ankle and leg flexibility.  Improve or keep muscle strength and endurance.  Improve cardiovascular fitness.  Use proper technique.  Use proper equipment (shoes, skates).  To help prevent recurrence, taping, protective strapping, or an adhesive bandage may be recommended for several weeks after healing is complete. PROGNOSIS   Recovery may take weeks to several months to heal.  Longer recovery is expected if symptoms have been prolonged.  Recovery is usually quicker if the inflammation is due to a direct blow as compared with overuse or sudden strain. RELATED COMPLICATIONS   Healing time will be prolonged if the condition is not correctly treated. The injury must be given plenty of time to heal.  Symptoms can reoccur if activity is resumed too soon.  Untreated, tendinitis may increase the risk of tendon rupture requiring additional time for recovery   and possibly surgery. TREATMENT   The first treatment consists of rest anti-inflammatory medication, and ice to relieve the pain.  Stretching and strengthening exercises after resolution of pain will likely help reduce the risk of recurrence. Referral to a physical therapist or athletic trainer for further evaluation and treatment may be helpful.  A walking boot or cast may be recommended to rest the Achilles tendon. This can help break the cycle of inflammation and microtrauma.  Arch supports (orthotics) may be prescribed or recommended by your caregiver as an adjunct to therapy and rest.  Surgery to remove the inflamed tendon lining or degenerated tendon tissue is rarely necessary and has shown less than predictable results. MEDICATION   Nonsteroidal anti-inflammatory medications, such as aspirin and ibuprofen, may be used for pain and inflammation relief. Do not take within 7 days before surgery. Take  these as directed by your caregiver. Contact your caregiver immediately if any bleeding, stomach upset, or signs of allergic reaction occur. Other minor pain relievers, such as acetaminophen, may also be used.  Pain relievers may be prescribed as necessary by your caregiver. Do not take prescription pain medication for longer than 4 to 7 days. Use only as directed and only as much as you need.  Cortisone injections are rarely indicated. Cortisone injections may weaken tendons and predispose to rupture. It is better to give the condition more time to heal than to use them. HEAT AND COLD  Cold is used to relieve pain and reduce inflammation for acute and chronic Achilles tendinitis. Cold should be applied for 10 to 15 minutes every 2 to 3 hours for inflammation and pain and immediately after any activity that aggravates your symptoms. Use ice packs or an ice massage.  Heat may be used before performing stretching and strengthening activities prescribed by your caregiver. Use a heat pack or a warm soak. SEEK MEDICAL CARE IF:  Symptoms get worse or do not improve in 2 weeks despite treatment.  New, unexplained symptoms develop. Drugs used in treatment may produce side effects.  EXERCISES:  RANGE OF MOTION (ROM) AND STRETCHING EXERCISES - Achilles Tendinitis  These exercises may help you when beginning to rehabilitate your injury. Your symptoms may resolve with or without further involvement from your physician, physical therapist or athletic trainer. While completing these exercises, remember:   Restoring tissue flexibility helps normal motion to return to the joints. This allows healthier, less painful movement and activity.  An effective stretch should be held for at least 30 seconds.  A stretch should never be painful. You should only feel a gentle lengthening or release in the stretched tissue.  STRETCH  Gastroc, Standing   Place hands on wall.  Extend right / left leg, keeping the  front knee somewhat bent.  Slightly point your toes inward on your back foot.  Keeping your right / left heel on the floor and your knee straight, shift your weight toward the wall, not allowing your back to arch.  You should feel a gentle stretch in the right / left calf. Hold this position for 10 seconds. Repeat 3 times. Complete this stretch 2 times per day.  STRETCH  Soleus, Standing   Place hands on wall.  Extend right / left leg, keeping the other knee somewhat bent.  Slightly point your toes inward on your back foot.  Keep your right / left heel on the floor, bend your back knee, and slightly shift your weight over the back leg so that you feel a   gentle stretch deep in your back calf.  Hold this position for 10 seconds. Repeat 3 times. Complete this stretch 2 times per day.  STRETCH  Gastrocsoleus, Standing  Note: This exercise can place a lot of stress on your foot and ankle. Please complete this exercise only if specifically instructed by your caregiver.   Place the ball of your right / left foot on a step, keeping your other foot firmly on the same step.  Hold on to the wall or a rail for balance.  Slowly lift your other foot, allowing your body weight to press your heel down over the edge of the step.  You should feel a stretch in your right / left calf.  Hold this position for 10 seconds.  Repeat this exercise with a slight bend in your knee. Repeat 3 times. Complete this stretch 2 times per day.   STRENGTHENING EXERCISES - Achilles Tendinitis These exercises may help you when beginning to rehabilitate your injury. They may resolve your symptoms with or without further involvement from your physician, physical therapist or athletic trainer. While completing these exercises, remember:   Muscles can gain both the endurance and the strength needed for everyday activities through controlled exercises.  Complete these exercises as instructed by your physician,  physical therapist or athletic trainer. Progress the resistance and repetitions only as guided.  You may experience muscle soreness or fatigue, but the pain or discomfort you are trying to eliminate should never worsen during these exercises. If this pain does worsen, stop and make certain you are following the directions exactly. If the pain is still present after adjustments, discontinue the exercise until you can discuss the trouble with your clinician.  STRENGTH - Plantar-flexors   Sit with your right / left leg extended. Holding onto both ends of a rubber exercise band/tubing, loop it around the ball of your foot. Keep a slight tension in the band.  Slowly push your toes away from you, pointing them downward.  Hold this position for 10 seconds. Return slowly, controlling the tension in the band/tubing. Repeat 3 times. Complete this exercise 2 times per day.   STRENGTH - Plantar-flexors   Stand with your feet shoulder width apart. Steady yourself with a wall or table using as little support as needed.  Keeping your weight evenly spread over the width of your feet, rise up on your toes.*  Hold this position for 10 seconds. Repeat 3 times. Complete this exercise 2 times per day.  *If this is too easy, shift your weight toward your right / left leg until you feel challenged. Ultimately, you may be asked to do this exercise with your right / left foot only.  STRENGTH  Plantar-flexors, Eccentric  Note: This exercise can place a lot of stress on your foot and ankle. Please complete this exercise only if specifically instructed by your caregiver.   Place the balls of your feet on a step. With your hands, use only enough support from a wall or rail to keep your balance.  Keep your knees straight and rise up on your toes.  Slowly shift your weight entirely to your right / left toes and pick up your opposite foot. Gently and with controlled movement, lower your weight through your right /  left foot so that your heel drops below the level of the step. You will feel a slight stretch in the back of your calf at the end position.  Use the healthy leg to help rise up onto   the balls of both feet, then lower weight only on the right / left leg again. Build up to 15 repetitions. Then progress to 3 consecutive sets of 15 repetitions.*  After completing the above exercise, complete the same exercise with a slight knee bend (about 30 degrees). Again, build up to 15 repetitions. Then progress to 3 consecutive sets of 15 repetitions.* Perform this exercise 2 times per day.  *When you easily complete 3 sets of 15, your physician, physical therapist or athletic trainer may advise you to add resistance by wearing a backpack filled with additional weight.  STRENGTH - Plantar Flexors, Seated   Sit on a chair that allows your feet to rest flat on the ground. If necessary, sit at the edge of the chair.  Keeping your toes firmly on the ground, lift your right / left heel as far as you can without increasing any discomfort in your ankle. Repeat 3 times. Complete this exercise 2 times a day.  

## 2013-09-06 NOTE — Progress Notes (Signed)
Subjective:     Patient ID: Joshua Moses, male   DOB: 06/11/1960, 53 y.o.   MRN: [id number]  HPI patient presents with extreme pain in the plantar aspect of the right heel in the back of the left heel medial side stating that the bottom of the heels been about 8 months and that the back of the heel most likely followed afterwards. His try to soak it is try to reduce activity and use medications   Review of Systems  All other systems reviewed and are negative.      Objective:   Physical Exam  Nursing note and vitals reviewed. Constitutional: He is oriented to person, place, and time.  Cardiovascular: Intact distal pulses.   Musculoskeletal: Normal range of motion.  Neurological: He is oriented to person, place, and time.  Skin: Skin is warm.   neurovascular status found to be intact with muscle strength adequate and range of motion of the subtalar and midtarsal joint within normal limits. Patient is found to have exquisite discomfort plantar aspect right heel the insertion of the tendon into the calcaneus and discomfort in the posterior Achilles tendon medial side with a central lateral band not affected at the current time. euinas of a mild nature is noted both feet     Assessment:     Plantar fasciitis of the right heel #1 and Achilles tendinitis left medial side    Plan:     H&P and x-rays reviewed with patient. Injected the right plantar fascia 3 mg Kenalog 5 mg Xylocaine Marcaine mixture and carefully injected the medial Achilles tendon after explaining the chances or rupture prior to the procedure and risk with 3 mg dexamethasone Kenalog and 5 mg Xylocaine. Advised him on reduced activity and dispensed a fascially brace for the right and also placed on Voltaren 75 mg twice a day. Reappoint to recheck again in one week

## 2013-09-06 NOTE — Progress Notes (Signed)
   Subjective:    Patient ID: Joshua Moses, male    DOB: 1960-08-14, 53 y.o.   MRN: [id number]  HPI PT STATED BOTH BACK AND BOTTOM HEELS ARE SORE FOR ABOUT 8 MONTHS. THE FEET ARE GETTING WORSE. THE FEET GET AGGRAVATED BY WALKING, STANDING, AND SITTING. TRIED SOAK IT WITH WARM WATER AND MEDICATION FOR MUSCLE RELAXER.    Review of Systems  Constitutional: Positive for activity change, fatigue and unexpected weight change.  Eyes: Positive for visual disturbance.  Respiratory: Positive for wheezing.   Musculoskeletal: Positive for back pain, gait problem, joint swelling and myalgias.  Neurological: Positive for weakness and numbness.  All other systems reviewed and are negative.      Objective:   Physical Exam        Assessment & Plan:

## 2013-09-12 ENCOUNTER — Ambulatory Visit (INDEPENDENT_AMBULATORY_CARE_PROVIDER_SITE_OTHER): Payer: BC Managed Care – PPO | Admitting: Podiatry

## 2013-09-12 ENCOUNTER — Encounter: Payer: Self-pay | Admitting: Podiatry

## 2013-09-12 VITALS — BP 150/88 | HR 51 | Resp 16 | Ht 63.0 in | Wt 165.0 lb

## 2013-09-12 DIAGNOSIS — M7660 Achilles tendinitis, unspecified leg: Secondary | ICD-10-CM

## 2013-09-12 DIAGNOSIS — M722 Plantar fascial fibromatosis: Secondary | ICD-10-CM

## 2013-09-12 MED ORDER — TRIAMCINOLONE ACETONIDE 10 MG/ML IJ SUSP
10.0000 mg | Freq: Once | INTRAMUSCULAR | Status: AC
  Administered 2013-09-12: 10 mg

## 2013-09-13 ENCOUNTER — Ambulatory Visit: Payer: BC Managed Care – PPO | Admitting: Podiatry

## 2013-09-13 NOTE — Progress Notes (Signed)
Subjective:     Patient ID: Joshua Moses, male   DOB: 1961/01/24, 53 y.o.   MRN: [id number]  HPI patient states he is doing much better since we did the injections in the plantar right heel and posterior left heel stating the right is still sore with ambulation but improved over where it was   Review of Systems     Objective:   Physical Exam Neurovascular status intact with continued discomfort right plantar heel of a moderate nature and posterior pain left that has improved dramatically    Assessment:     Plantar fasciitis right still present with Achilles tendinitis left that has gotten quite a bit better    Plan:     Reviewed condition and did reinject the plantar fascia right 3 mg Kenalog 5 mg Xylocaine Marcaine mixture and gave instructions for stretching of the left Achilles tendon. Scanned for custom orthotics to reduce stress against feet with slight lift on the left side

## 2013-10-05 ENCOUNTER — Other Ambulatory Visit: Payer: BC Managed Care – PPO

## 2013-10-09 ENCOUNTER — Other Ambulatory Visit: Payer: BC Managed Care – PPO

## 2014-01-14 ENCOUNTER — Ambulatory Visit (INDEPENDENT_AMBULATORY_CARE_PROVIDER_SITE_OTHER): Payer: BC Managed Care – PPO | Admitting: Podiatry

## 2014-01-14 ENCOUNTER — Encounter: Payer: Self-pay | Admitting: Podiatry

## 2014-01-14 VITALS — BP 125/76 | HR 60 | Resp 16

## 2014-01-14 DIAGNOSIS — M7660 Achilles tendinitis, unspecified leg: Secondary | ICD-10-CM

## 2014-01-14 DIAGNOSIS — M722 Plantar fascial fibromatosis: Secondary | ICD-10-CM

## 2014-01-14 MED ORDER — TRIAMCINOLONE ACETONIDE 10 MG/ML IJ SUSP
10.0000 mg | Freq: Once | INTRAMUSCULAR | Status: AC
  Administered 2014-01-14: 10 mg

## 2014-01-14 NOTE — Progress Notes (Signed)
Subjective:     Patient ID: Joshua Moses, male   DOB: May 06, 1961, 53 y.o.   MRN: [id number]  HPI patient presents stating the back of my left heel has been hurting and I'm thinking I could have possibly torn something as I felt a pop last week teary at patient states that he's been walking normally but is having pain in the back of the heel   Review of Systems     Objective:   Physical Exam Neurovascular status intact with   checking of the Achilles tendon left and right with no indication of tear and I did not note any bruising or other discoloration in the posterior heel area. The lateral ankle is very tender at the insertion of the Achilles with the other portion of the tendon been intact and nontender Assessment:     Probable Achilles tendinitis left with no indication of tear    Plan:     Did dispensed orthotics with several months ago and discussed a very careful injection explaining risk of cortisone injection to the posterior heel. Patient wants to have this done and today on the lateral side I injected 3 mg Kenalog dexamethasone mixed 5 mg Xylocaine and then advised on reduced activity and explained stretching exercises to start in 1 week

## 2014-02-20 ENCOUNTER — Other Ambulatory Visit: Payer: Self-pay | Admitting: Family Medicine

## 2014-07-05 ENCOUNTER — Telehealth: Payer: Self-pay | Admitting: Family Medicine

## 2014-07-05 ENCOUNTER — Telehealth: Payer: Self-pay

## 2014-07-05 NOTE — Telephone Encounter (Signed)
[facility] Audria [facility].

## 2014-07-05 NOTE — Telephone Encounter (Signed)
Called Mr. Joshua Moses to notify form is complete. Patient stated his wife will pick the form up because he is getting for work. Patient asked about his wife form. Stated to patient I only have a form for him to pick up. Form will be placed at 104.

## 2014-07-05 NOTE — Telephone Encounter (Signed)
Form completed and given to [address] front desk to call pt for pick-up.

## 2014-07-05 NOTE — Telephone Encounter (Signed)
Patient had a CPE in March of last year.  Can we please fill out his work form based on this?  He cant get another one yet, but he has to have the paperwork in before he can have another physical.  These forms have been left at the nurses desk.  Call him when ready to pick up.  [phone]

## 2014-07-07 NOTE — Telephone Encounter (Signed)
Pt's form completed. I have not seen a form for his wife.

## 2014-08-29 ENCOUNTER — Ambulatory Visit (INDEPENDENT_AMBULATORY_CARE_PROVIDER_SITE_OTHER): Payer: BLUE CROSS/BLUE SHIELD | Admitting: Family Medicine

## 2014-08-29 ENCOUNTER — Encounter: Payer: Self-pay | Admitting: Family Medicine

## 2014-08-29 ENCOUNTER — Ambulatory Visit (INDEPENDENT_AMBULATORY_CARE_PROVIDER_SITE_OTHER): Payer: BLUE CROSS/BLUE SHIELD

## 2014-08-29 ENCOUNTER — Other Ambulatory Visit: Payer: Self-pay | Admitting: Family Medicine

## 2014-08-29 VITALS — BP 129/79 | HR 56 | Temp 97.7°F | Resp 16 | Ht 63.0 in | Wt 161.6 lb

## 2014-08-29 DIAGNOSIS — G8929 Other chronic pain: Secondary | ICD-10-CM

## 2014-08-29 DIAGNOSIS — R5382 Chronic fatigue, unspecified: Secondary | ICD-10-CM | POA: Diagnosis not present

## 2014-08-29 DIAGNOSIS — R21 Rash and other nonspecific skin eruption: Secondary | ICD-10-CM

## 2014-08-29 DIAGNOSIS — M2550 Pain in unspecified joint: Secondary | ICD-10-CM | POA: Diagnosis not present

## 2014-08-29 DIAGNOSIS — M791 Myalgia, unspecified site: Secondary | ICD-10-CM

## 2014-08-29 DIAGNOSIS — R0789 Other chest pain: Secondary | ICD-10-CM | POA: Diagnosis not present

## 2014-08-29 DIAGNOSIS — R7989 Other specified abnormal findings of blood chemistry: Secondary | ICD-10-CM

## 2014-08-29 LAB — THYROID PANEL WITH TSH
FREE THYROXINE INDEX: 2 (ref 1.4–3.8)
T3 Uptake: 29 % (ref 22–35)
T4 TOTAL: 6.8 ug/dL (ref 4.5–12.0)
TSH: 1.984 u[IU]/mL (ref 0.350–4.500)

## 2014-08-29 LAB — CBC WITH DIFFERENTIAL/PLATELET
BASOS PCT: 1 % (ref 0–1)
Basophils Absolute: 0.1 10*3/uL (ref 0.0–0.1)
Eosinophils Absolute: 0.3 10*3/uL (ref 0.0–0.7)
Eosinophils Relative: 5 % (ref 0–5)
HCT: 42.5 % (ref 39.0–52.0)
Hemoglobin: 14.4 g/dL (ref 13.0–17.0)
Lymphocytes Relative: 31 % (ref 12–46)
Lymphs Abs: 1.6 10*3/uL (ref 0.7–4.0)
MCH: 30.6 pg (ref 26.0–34.0)
MCHC: 33.9 g/dL (ref 30.0–36.0)
MCV: 90.2 fL (ref 78.0–100.0)
MONO ABS: 0.7 10*3/uL (ref 0.1–1.0)
MPV: 10 fL (ref 8.6–12.4)
Monocytes Relative: 13 % — ABNORMAL HIGH (ref 3–12)
Neutro Abs: 2.5 10*3/uL (ref 1.7–7.7)
Neutrophils Relative %: 50 % (ref 43–77)
PLATELETS: 333 10*3/uL (ref 150–400)
RBC: 4.71 MIL/uL (ref 4.22–5.81)
RDW: 12.5 % (ref 11.5–15.5)
WBC: 5 10*3/uL (ref 4.0–10.5)

## 2014-08-29 LAB — COMPLETE METABOLIC PANEL WITH GFR
ALK PHOS: 79 U/L (ref 39–117)
ALT: 21 U/L (ref 0–53)
AST: 23 U/L (ref 0–37)
Albumin: 4.2 g/dL (ref 3.5–5.2)
BUN: 11 mg/dL (ref 6–23)
CHLORIDE: 106 meq/L (ref 96–112)
CO2: 24 mEq/L (ref 19–32)
Calcium: 9 mg/dL (ref 8.4–10.5)
Creat: 1.05 mg/dL (ref 0.50–1.35)
GFR, EST NON AFRICAN AMERICAN: 80 mL/min
GFR, Est African American: >89 mL/min
GLUCOSE: 94 mg/dL (ref 70–99)
Potassium: 4.5 mEq/L (ref 3.5–5.3)
SODIUM: 141 meq/L (ref 135–145)
TOTAL PROTEIN: 7.4 g/dL (ref 6.0–8.3)
Total Bilirubin: 0.5 mg/dL (ref 0.2–1.2)

## 2014-08-29 MED ORDER — ACETAMINOPHEN-CODEINE #2 300-15 MG PO TABS: 1.0000 | ORAL_TABLET | ORAL | Status: AC | PRN

## 2014-08-29 NOTE — Progress Notes (Signed)
Subjective:    Patient ID: Joshua Moses, male    DOB: Jan 02, 1961, 54 y.o.   MRN: [id number]  HPI This 54 y.o. male presents for evaluation of multiple symptoms- generalized joint and muscle pain (initially starting in foot then progressing to knees/hips and shoulders), onset > 1 year ago. C/o soreness w/ prolonged activity. Denies joint redness or swelling or warmth.  Also c/o fatigue and problems w/ sleep >> low energy.  Having sternal pain not associated w/ SOB or cough. Does have hx of tick-exposure; denies fever, sore throat, abd pain, HA, dizziness or weakness. Has a faint nonspecific rash on inner thighs, onset w/in last few weeks; non-pruritic.  Patient Active Problem List   Diagnosis Date Noted  . Pain in joint, ankle and foot 08/14/2013  . Neck pain, chronic 02/15/2013  . IBS (irritable bowel syndrome)   . Colon polyps   . Esophagus, Barrett's     Prior to Admission medications   Medication Sig Start Date End Date Taking? Authorizing Provider  acetaminophen-codeine (TYLENOL #2) 300-15 MG per tablet Take 1 tablet by mouth every 4 (four) hours as needed for moderate pain. 08/29/14   Maurice March, MD  meloxicam (MOBIC) 15 MG tablet Take 1 tablet (15 mg total) by mouth daily. PATIENT NEEDS OFFICE VISIT FOR ADDITIONAL REFILLS Patient not taking: Reported on 08/29/2014 02/21/14   Maurice March, MD   Past Surgical History  Procedure Laterality Date  . Cervical discectomy      1998  . Fracture surgery    . Vasectomy      History   Social History  . Marital Status: Married    Spouse Name: N/A  . Number of Children: N/A  . Years of Education: N/A   Occupational History  . Not on file.   Social History Main Topics  . Smoking status: Former Smoker -- 0.50 packs/day    Quit date: 06/15/1998  . Smokeless tobacco: Current User  . Alcohol Use: 6.0 oz/week    10 Cans of beer per week  . Drug Use: Not on file  . Sexual Activity: Not on file   Other Topics Concern  .  Not on file   Social History Narrative    Family History  Problem Relation Age of Onset  . Cancer Mother     esophageal/lung  . Hypertension Mother   . Cancer Paternal Grandmother     liver ca  . Hyperlipidemia Father     Review of Systems  Constitutional: Positive for fatigue. Negative for fever, chills, diaphoresis, activity change and unexpected weight change.  HENT: Negative.   Eyes: Negative.   Respiratory: Positive for chest tightness. Negative for cough, choking, shortness of breath and wheezing.   Cardiovascular: Positive for chest pain. Negative for palpitations and leg swelling.  Gastrointestinal: Negative.   Musculoskeletal: Positive for myalgias and arthralgias. Negative for back pain, joint swelling and gait problem.  Skin: Positive for rash.  Neurological: Negative.   Psychiatric/Behavioral: Positive for sleep disturbance. Negative for confusion, dysphoric mood, decreased concentration and agitation. The patient is nervous/anxious.        Objective:   Physical Exam  Constitutional: He is oriented to person, place, and time. Vital signs are normal. He appears well-developed and well-nourished. No distress.  Blood pressure 129/79, pulse 56, temperature 97.7 F (36.5 C), temperature source Oral, resp. rate 16, height  (1.6 m), weight 161 lb 9.6 oz (73.301 kg), SpO2 98 %.   HENT:  Head: Normocephalic and  atraumatic.  Right Ear: Hearing, tympanic membrane, external ear and ear canal normal.  Left Ear: Hearing, tympanic membrane, external ear and ear canal normal.  Nose: Nose normal. No mucosal edema, nasal deformity or septal deviation.  Mouth/Throat: Uvula is midline, oropharynx is clear and moist and mucous membranes are normal. No oral lesions. Normal dentition. No uvula swelling.  Eyes: Conjunctivae and EOM are normal. No scleral icterus.  Neck: Trachea normal, normal range of motion and phonation normal. Neck supple. No spinous process tenderness and no  muscular tenderness present. Normal range of motion present. No thyroid mass and no thyromegaly present.  Cardiovascular: Normal rate, regular rhythm, S1 normal, normal heart sounds and normal pulses.   No extrasystoles are present. PMI is not displaced.  Exam reveals no gallop and no friction rub.   No murmur heard. Pulmonary/Chest: Effort normal and breath sounds normal. No respiratory distress. He has no decreased breath sounds. He has no wheezes. He has no rhonchi. He exhibits no mass, no tenderness, no bony tenderness, no deformity and no swelling.  Abdominal: Soft. Normal appearance and bowel sounds are normal. He exhibits no distension and no mass. There is no hepatosplenomegaly. There is no tenderness. There is no guarding and no CVA tenderness.  Musculoskeletal:       Right shoulder: Normal.       Left elbow: Normal.       Right wrist: Normal.       Left wrist: Normal.       Right knee: Normal.       Left knee: Normal.       Cervical back: Normal.       Thoracic back: Normal.       Lumbar back: Normal.  Remainder of exam unremarkable.  Lymphadenopathy:       Head (right side): No submental, no submandibular, no tonsillar, no preauricular, no posterior auricular and no occipital adenopathy present.       Head (left side): No submental, no submandibular, no tonsillar, no preauricular, no posterior auricular and no occipital adenopathy present.    He has no cervical adenopathy.    He has no axillary adenopathy.       Right: No inguinal and no supraclavicular adenopathy present.       Left: No inguinal and no supraclavicular adenopathy present.  Neurological: He is alert and oriented to person, place, and time. He has normal strength and normal reflexes. He displays no atrophy. No cranial nerve deficit or sensory deficit. He exhibits normal muscle tone. Coordination and gait normal.  Skin: Skin is warm and dry. Rash noted. No ecchymosis and no petechiae noted. Rash is papular. He is not  diaphoretic. No cyanosis or erythema. No pallor. Nails show no clubbing.  Scattered papular rash on inner thighs( has appearance of friction rub).  Psychiatric: His speech is normal and behavior is normal. Judgment and thought content normal. His mood appears anxious. His affect is inappropriate. His affect is not labile. Cognition and memory are normal.  Nursing note and vitals reviewed.   [facility] reading (PRIMARY) by  Dr. Audria Nine: Normal cardiac silhouette; no infiltrate, nodules or effusions. Thoracic spine degenerative disease.     Assessment & Plan:  Chest pain, atypical - Plan: HIV antibody, CBC with Differential/Platelet, DG Chest 2 View  Chronic joint pain - Plan: HIV antibody, Sedimentation rate, Thyroid Panel With TSH, CBC with Differential/Platelet, ANA  Chronic fatigue - Plan: HIV antibody, Testosterone, Free, Total, SHBG, COMPLETE METABOLIC PANEL WITH GFR, Thyroid Panel  With TSH, CBC with Differential/Platelet, ANA  Rash - Plan: [place] spotted fvr ab, IgM-blood, B. Burgdorfi Antibodies, COMPLETE METABOLIC PANEL WITH GFR, CBC with Differential/Platelet

## 2014-08-29 NOTE — Patient Instructions (Addendum)
Fatigue Fatigue is a feeling of tiredness, lack of energy, lack of motivation, or feeling tired all the time. Having enough rest, good nutrition, and reducing stress will normally reduce fatigue. Consult your caregiver if it persists. The nature of your fatigue will help your caregiver to find out its cause. The treatment is based on the cause.  CAUSES  There are many causes for fatigue. Most of the time, fatigue can be traced to one or more of your habits or routines. Most causes fit into one or more of three general areas. They are: Lifestyle problems  Sleep disturbances.  Overwork.  Physical exertion.  Unhealthy habits.  Poor eating habits or eating disorders.  Alcohol and/or drug use .  Lack of proper nutrition (malnutrition). Psychological problems  Stress and/or anxiety problems.  Depression.  Grief.  Boredom. Medical Problems or Conditions  Anemia.  Pregnancy.  Thyroid gland problems.  Recovery from major surgery.  Continuous pain.  Emphysema or asthma that is not well controlled  Allergic conditions.  Diabetes.  Infections (such as mononucleosis).  Obesity.  Sleep disorders, such as sleep apnea.  Heart failure or other heart-related problems.  Cancer.  Kidney disease.  Liver disease.  Effects of certain medicines such as antihistamines, cough and cold remedies, prescription pain medicines, heart and blood pressure medicines, drugs used for treatment of cancer, and some antidepressants. SYMPTOMS  The symptoms of fatigue include:   Lack of energy.  Lack of drive (motivation).  Drowsiness.  Feeling of indifference to the surroundings. DIAGNOSIS  The details of how you feel help guide your caregiver in finding out what is causing the fatigue. You will be asked about your present and past health condition. It is important to review all medicines that you take, including prescription and non-prescription items. A thorough exam will be done.  You will be questioned about your feelings, habits, and normal lifestyle. Your caregiver may suggest blood tests, urine tests, or other tests to look for common medical causes of fatigue.  TREATMENT  Fatigue is treated by correcting the underlying cause. For example, if you have continuous pain or depression, treating these causes will improve how you feel. Similarly, adjusting the dose of certain medicines will help in reducing fatigue.  HOME CARE INSTRUCTIONS   Try to get the required amount of good sleep every night.  Eat a healthy and nutritious diet, and drink enough water throughout the day.  Practice ways of relaxing (including yoga or meditation).  Exercise regularly.  Make plans to change situations that cause stress. Act on those plans so that stresses decrease over time. Keep your work and personal routine reasonable.  Avoid street drugs and minimize use of alcohol.  Start taking a daily multivitamin after consulting your caregiver. SEEK MEDICAL CARE IF:   You have persistent tiredness, which cannot be accounted for.  You have fever.  You have unintentional weight loss.  You have headaches.  You have disturbed sleep throughout the night.  You are feeling sad.  You have constipation.  You have dry skin.  You have gained weight.  You are taking any new or different medicines that you suspect are causing fatigue.  You are unable to sleep at night.  You develop any unusual swelling of your legs or other parts of your body. SEEK IMMEDIATE MEDICAL CARE IF:   You are feeling confused.  Your vision is blurred.  You feel faint or pass out.  You develop severe headache.  You develop severe abdominal, pelvic, or   back pain.  You develop chest pain, shortness of breath, or an irregular or fast heartbeat.  You are unable to pass a normal amount of urine.  You develop abnormal bleeding such as bleeding from the rectum or you vomit blood.  You have thoughts  about harming yourself or committing suicide.  You are worried that you might harm someone else. MAKE SURE YOU:   Understand these instructions.  Will watch your condition.  Will get help right away if you are not doing well or get worse. Document Released: 03/07/2007 Document Revised: 08/02/2011 Document Reviewed: 09/11/2013 [facility] Patient Information 2015 [place], [place]. This information is not intended to replace advice given to you by your health care provider. Make sure you discuss any questions you have with your health care provider.   Arthralgia Your caregiver has diagnosed you as suffering from an arthralgia. Arthralgia means there is pain in a joint. This can come from many reasons including:  Bruising the joint which causes soreness (inflammation) in the joint.  Wear and tear on the joints which occur as we grow older (osteoarthritis).  Overusing the joint.  Various forms of arthritis.  Infections of the joint. Regardless of the cause of pain in your joint, most of these different pains respond to anti-inflammatory drugs and rest. The exception to this is when a joint is infected, and these cases are treated with antibiotics, if it is a bacterial infection. HOME CARE INSTRUCTIONS   Rest the injured area for as long as directed by your caregiver. Then slowly start using the joint as directed by your caregiver and as the pain allows. Crutches as directed may be useful if the ankles, knees or hips are involved. If the knee was splinted or casted, continue use and care as directed. If an stretchy or elastic wrapping bandage has been applied today, it should be removed and re-applied every 3 to 4 hours. It should not be applied tightly, but firmly enough to keep swelling down. Watch toes and feet for swelling, bluish discoloration, coldness, numbness or excessive pain. If any of these problems (symptoms) occur, remove the ace bandage and re-apply more loosely. If these symptoms  persist, contact your caregiver or return to this location.  For the first 24 hours, keep the injured extremity elevated on pillows while lying down.  Apply ice for 15-20 minutes to the sore joint every couple hours while awake for the first half day. Then 03-04 times per day for the first 48 hours. Put the ice in a plastic bag and place a towel between the bag of ice and your skin.  Wear any splinting, casting, elastic bandage applications, or slings as instructed.  Only take over-the-counter or prescription medicines for pain, discomfort, or fever as directed by your caregiver. Do not use aspirin immediately after the injury unless instructed by your physician. Aspirin can cause increased bleeding and bruising of the tissues.  If you were given crutches, continue to use them as instructed and do not resume weight bearing on the sore joint until instructed. Persistent pain and inability to use the sore joint as directed for more than 2 to 3 days are warning signs indicating that you should see a caregiver for a follow-up visit as soon as possible. Initially, a hairline fracture (break in bone) may not be evident on X-rays. Persistent pain and swelling indicate that further evaluation, non-weight bearing or use of the joint (use of crutches or slings as instructed), or further X-rays are indicated. X-rays may  sometimes not show a small fracture until a week or 10 days later. Make a follow-up appointment with your own caregiver or one to whom we have referred you. A radiologist (specialist in reading X-rays) may read your X-rays. Make sure you know how you are to obtain your X-ray results. Do not assume everything is normal if you do not hear from us. SEEK MEDICAL CARE IF: Bruising, swelling, or pain increases. SEEK IMMEDIATE MEDICAL CARE IF:   Your fingers or toes are numb or blue.  The pain is not responding to medications and continues to stay the same or get worse.  The pain in your joint  becomes severe.  You develop a fever over 102 F (38.9 C).  It becomes impossible to move or use the joint. MAKE SURE YOU:   Understand these instructions.  Will watch your condition.  Will get help right away if you are not doing well or get worse. Document Released: 05/10/2005 Document Revised: 08/02/2011 Document Reviewed: 12/27/2007 [facility] Patient Information 2015 [place]. This information is not intended to replace advice given to you by your health care provider. Make sure you discuss any questions you have with your health care provider.    I have ordered a battery of test including labs for Tick-borne illnesses and tests for connective tissue disorders as well as low testosterone. You will contacted when all lab results are back.  Your chest xray appears normal.

## 2014-08-30 LAB — B. BURGDORFI ANTIBODIES: B BURGDORFERI AB IGG+ IGM: 0.42 {ISR}

## 2014-08-30 LAB — SEDIMENTATION RATE: Sed Rate: 9 mm/hr (ref 0–20)

## 2014-08-30 LAB — ANA: Anti Nuclear Antibody(ANA): NEGATIVE

## 2014-08-30 LAB — ROCKY MTN SPOTTED FVR AB, IGM-BLOOD: ROCKY MTN SPOTTED FEVER, IGM: 0.2 IV

## 2014-08-30 LAB — HIV ANTIBODY (ROUTINE TESTING W REFLEX): HIV: NONREACTIVE

## 2014-09-03 ENCOUNTER — Encounter: Payer: Self-pay | Admitting: Family Medicine

## 2014-09-05 LAB — TESTOSTERONE, FREE, TOTAL, SHBG
SEX HORMONE BINDING: 52 nmol/L — AB (ref 10–50)
TESTOSTERONE FREE: 42.4 pg/mL — AB (ref 47.0–244.0)
TESTOSTERONE-% FREE: 1.5 % — AB (ref 1.6–2.9)
Testosterone: 292 ng/dL — ABNORMAL LOW (ref 300–890)

## 2014-09-05 NOTE — Addendum Note (Signed)
Addended by: Thelma BargeICHARDSON, SHEKETIA D on: 09/05/2014 05:14 PM   Modules accepted: Orders

## 2014-09-05 NOTE — Addendum Note (Signed)
Addended by: Dow AdolphMCPHERSON, Chaselynn Kepple B on: 09/05/2014 03:24 PM   Modules accepted: Orders

## 2014-09-25 ENCOUNTER — Encounter: Payer: Self-pay | Admitting: Endocrinology

## 2014-09-25 ENCOUNTER — Ambulatory Visit (INDEPENDENT_AMBULATORY_CARE_PROVIDER_SITE_OTHER): Payer: BLUE CROSS/BLUE SHIELD | Admitting: Endocrinology

## 2014-09-25 VITALS — BP 124/80 | HR 55 | Temp 97.9°F | Ht 63.0 in | Wt 162.0 lb

## 2014-09-25 DIAGNOSIS — E291 Testicular hypofunction: Secondary | ICD-10-CM | POA: Diagnosis not present

## 2014-09-25 DIAGNOSIS — R7989 Other specified abnormal findings of blood chemistry: Secondary | ICD-10-CM | POA: Insufficient documentation

## 2014-09-25 LAB — FOLLICLE STIMULATING HORMONE: FSH: 4.4 m[IU]/mL (ref 1.4–18.1)

## 2014-09-25 LAB — LUTEINIZING HORMONE: LH: 4.9 m[IU]/mL (ref 1.50–9.30)
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
## 2014-09-25 NOTE — Patient Instructions (Addendum)
normalization of testosterone is not known to harm you.  however, there are "theoretical" risks, including increased fertility, hair loss, prostate cancer, benign prostate enlargement, blood clots, liver problems, lower hdl ("good cholesterol"), polycythemia (opposite of anemia), sleep apnea, and behavior changes.   Weight loss helps the testosterone also. blood tests are requested for you today.  We'll let you know about the results.

## 2014-09-26 LAB — TESTOSTERONE,FREE AND TOTAL
TESTOSTERONE FREE: 7.5 pg/mL (ref 7.2–24.0)
Testosterone: 565 ng/dL (ref 348–1197)

## 2014-09-26 LAB — PROLACTIN: PROLACTIN: 10.9 ng/mL (ref 2.1–17.1)

## 2014-10-31 ENCOUNTER — Emergency Department (HOSPITAL_COMMUNITY): Payer: BLUE CROSS/BLUE SHIELD

## 2014-10-31 ENCOUNTER — Emergency Department (HOSPITAL_COMMUNITY)
Admission: EM | Admit: 2014-10-31 | Discharge: 2014-10-31 | Disposition: A | Discharge status: Home / Self Care | Payer: BLUE CROSS/BLUE SHIELD | Attending: Emergency Medicine | Admitting: Emergency Medicine

## 2014-10-31 ENCOUNTER — Encounter (HOSPITAL_COMMUNITY): Payer: Self-pay | Admitting: *Deleted

## 2014-10-31 DIAGNOSIS — K5900 Constipation, unspecified: Secondary | ICD-10-CM | POA: Insufficient documentation

## 2014-10-31 DIAGNOSIS — Z87891 Personal history of nicotine dependence: Secondary | ICD-10-CM | POA: Insufficient documentation

## 2014-10-31 DIAGNOSIS — Z86010 Personal history of colonic polyps: Secondary | ICD-10-CM | POA: Diagnosis not present

## 2014-10-31 DIAGNOSIS — Z87828 Personal history of other (healed) physical injury and trauma: Secondary | ICD-10-CM | POA: Insufficient documentation

## 2014-10-31 DIAGNOSIS — R1012 Left upper quadrant pain: Secondary | ICD-10-CM | POA: Diagnosis present

## 2014-10-31 MED ORDER — DICYCLOMINE HCL 10 MG PO CAPS
20.0000 mg | ORAL_CAPSULE | Freq: Once | ORAL | Status: AC
  Administered 2014-10-31: 20 mg via ORAL
  Filled 2014-10-31: qty 2

## 2014-10-31 MED ORDER — DICYCLOMINE HCL 20 MG PO TABS: 20.0000 mg | ORAL_TABLET | Freq: Two times a day (BID) | ORAL | Status: AC
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
## 2014-10-31 NOTE — ED Notes (Signed)
Pt states that he was in the hospital last week for "trapped gas"; pt states that he feels like it is happening again; pt states that he feels bloated and when he lays down he feels like gas is trapped; pt states that he is able to belch at the present but that last week he was unable to belch or pass gas and feels like he is headed in that direction again

## 2014-10-31 NOTE — Discharge Instructions (Signed)

## 2014-12-03 ENCOUNTER — Ambulatory Visit: Payer: BLUE CROSS/BLUE SHIELD | Admitting: Physician Assistant

## 2021-05-05 ENCOUNTER — Other Ambulatory Visit: Payer: Self-pay

## 2021-05-05 ENCOUNTER — Emergency Department (HOSPITAL_BASED_OUTPATIENT_CLINIC_OR_DEPARTMENT_OTHER)
Admission: EM | Admit: 2021-05-05 | Discharge: 2021-05-05 | Disposition: A | Discharge status: Home / Self Care | Payer: Worker's Compensation | Attending: Emergency Medicine | Admitting: Emergency Medicine

## 2021-05-05 ENCOUNTER — Emergency Department (HOSPITAL_BASED_OUTPATIENT_CLINIC_OR_DEPARTMENT_OTHER): Payer: Worker's Compensation

## 2021-05-05 ENCOUNTER — Encounter (HOSPITAL_BASED_OUTPATIENT_CLINIC_OR_DEPARTMENT_OTHER): Payer: Self-pay | Admitting: *Deleted

## 2021-05-05 DIAGNOSIS — Z23 Encounter for immunization: Secondary | ICD-10-CM | POA: Insufficient documentation

## 2021-05-05 DIAGNOSIS — Y990 Civilian activity done for income or pay: Secondary | ICD-10-CM | POA: Insufficient documentation

## 2021-05-05 DIAGNOSIS — S61412A Laceration without foreign body of left hand, initial encounter: Secondary | ICD-10-CM | POA: Diagnosis not present

## 2021-05-05 DIAGNOSIS — Z87891 Personal history of nicotine dependence: Secondary | ICD-10-CM | POA: Insufficient documentation

## 2021-05-05 DIAGNOSIS — W311XXA Contact with metalworking machines, initial encounter: Secondary | ICD-10-CM | POA: Insufficient documentation

## 2021-05-05 DIAGNOSIS — S6992XA Unspecified injury of left wrist, hand and finger(s), initial encounter: Secondary | ICD-10-CM | POA: Diagnosis present

## 2021-05-05 MED ORDER — TETANUS-DIPHTH-ACELL PERTUSSIS 5-2.5-18.5 LF-MCG/0.5 IM SUSY
0.5000 mL | PREFILLED_SYRINGE | Freq: Once | INTRAMUSCULAR | Status: AC
  Administered 2021-05-05: 0.5 mL via INTRAMUSCULAR
  Filled 2021-05-05: qty 0.5

## 2021-05-05 MED ORDER — LIDOCAINE HCL (PF) 1 % IJ SOLN
10.0000 mL | Freq: Once | INTRAMUSCULAR | Status: AC
  Administered 2021-05-05: 10 mL
  Filled 2021-05-05: qty 10

## 2021-05-05 NOTE — Discharge Instructions (Addendum)
Follow-up with your PCP in 5 to 7 days to have the sutures removed.  If you see signs of infection return to the ED urgent care for evaluation.  Take Tylenol Motrin as needed for pain.

## 2021-05-05 NOTE — ED Triage Notes (Signed)
Laceration to his left hand with a cutting tool. Bleeding controlled.
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
## 2023-02-02 IMAGING — DX DG HAND COMPLETE 3+V*L*
3 series · 3 of 3 positions shown · non-contrast
Comparison: None.

CLINICAL DATA: Laceration to left hand

EXAM:
LEFT HAND - COMPLETE 3+ VIEW

[hand ap]
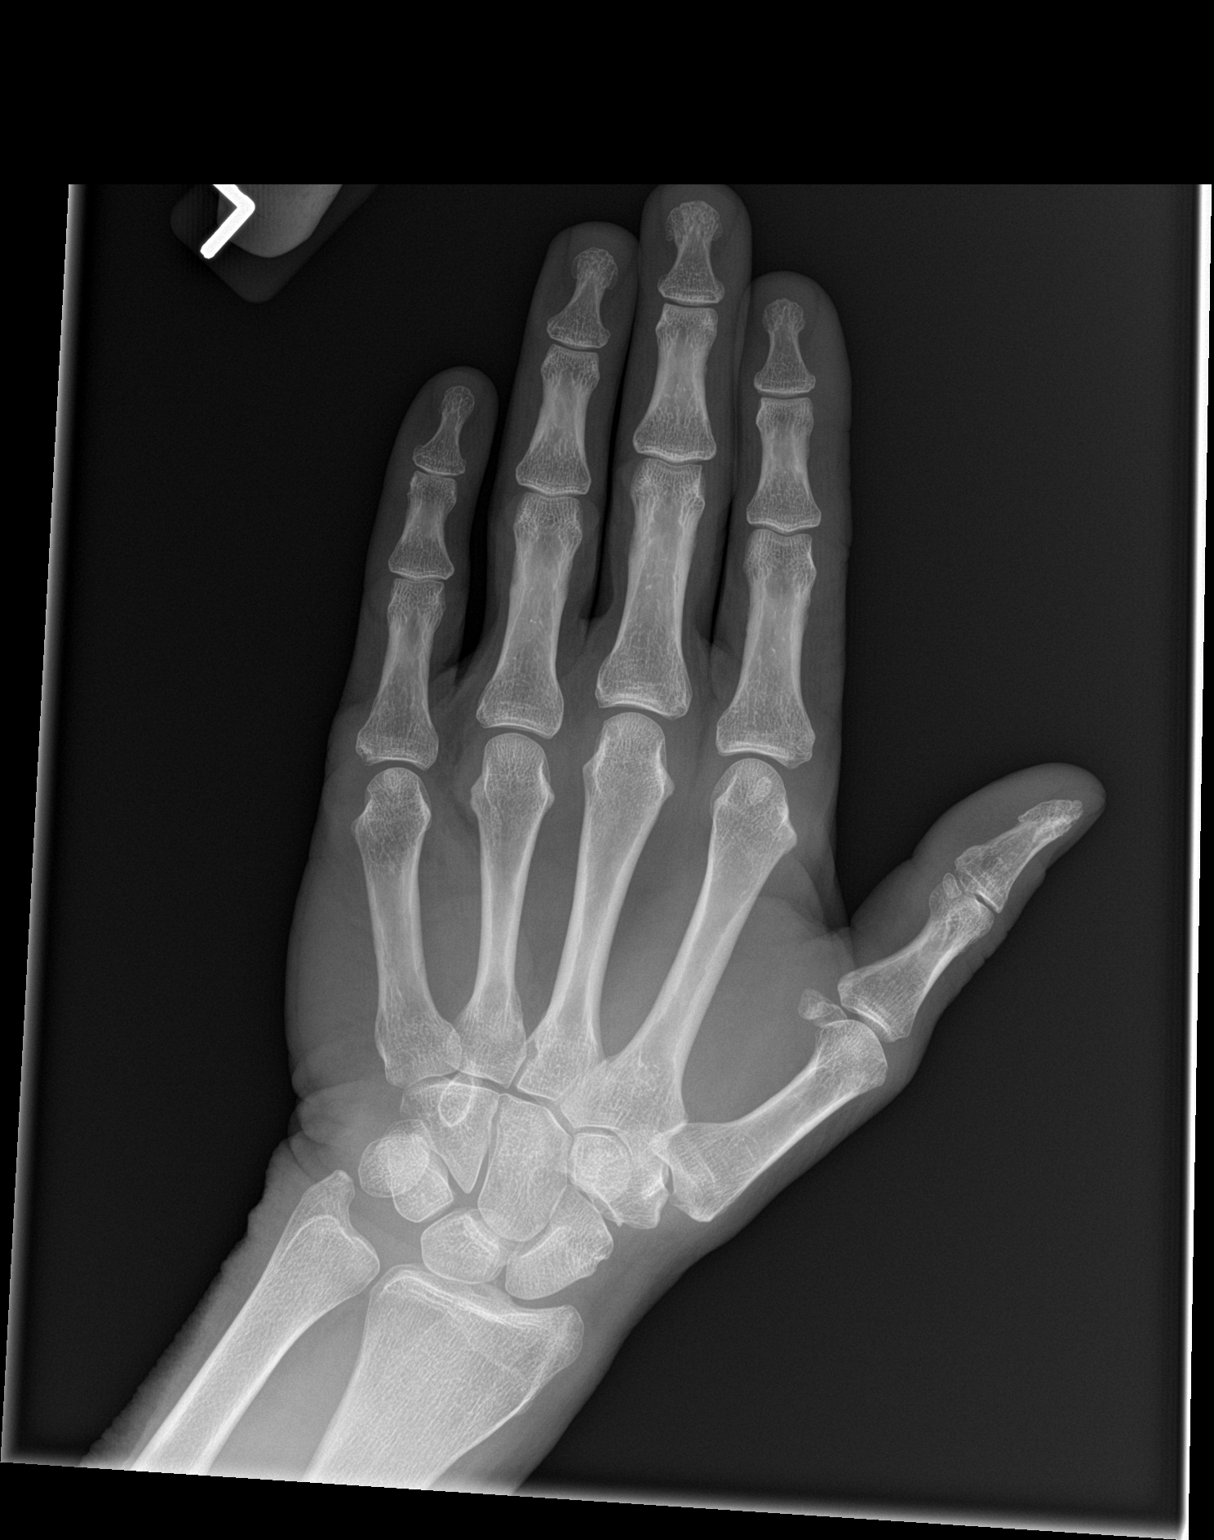

[hand obl]
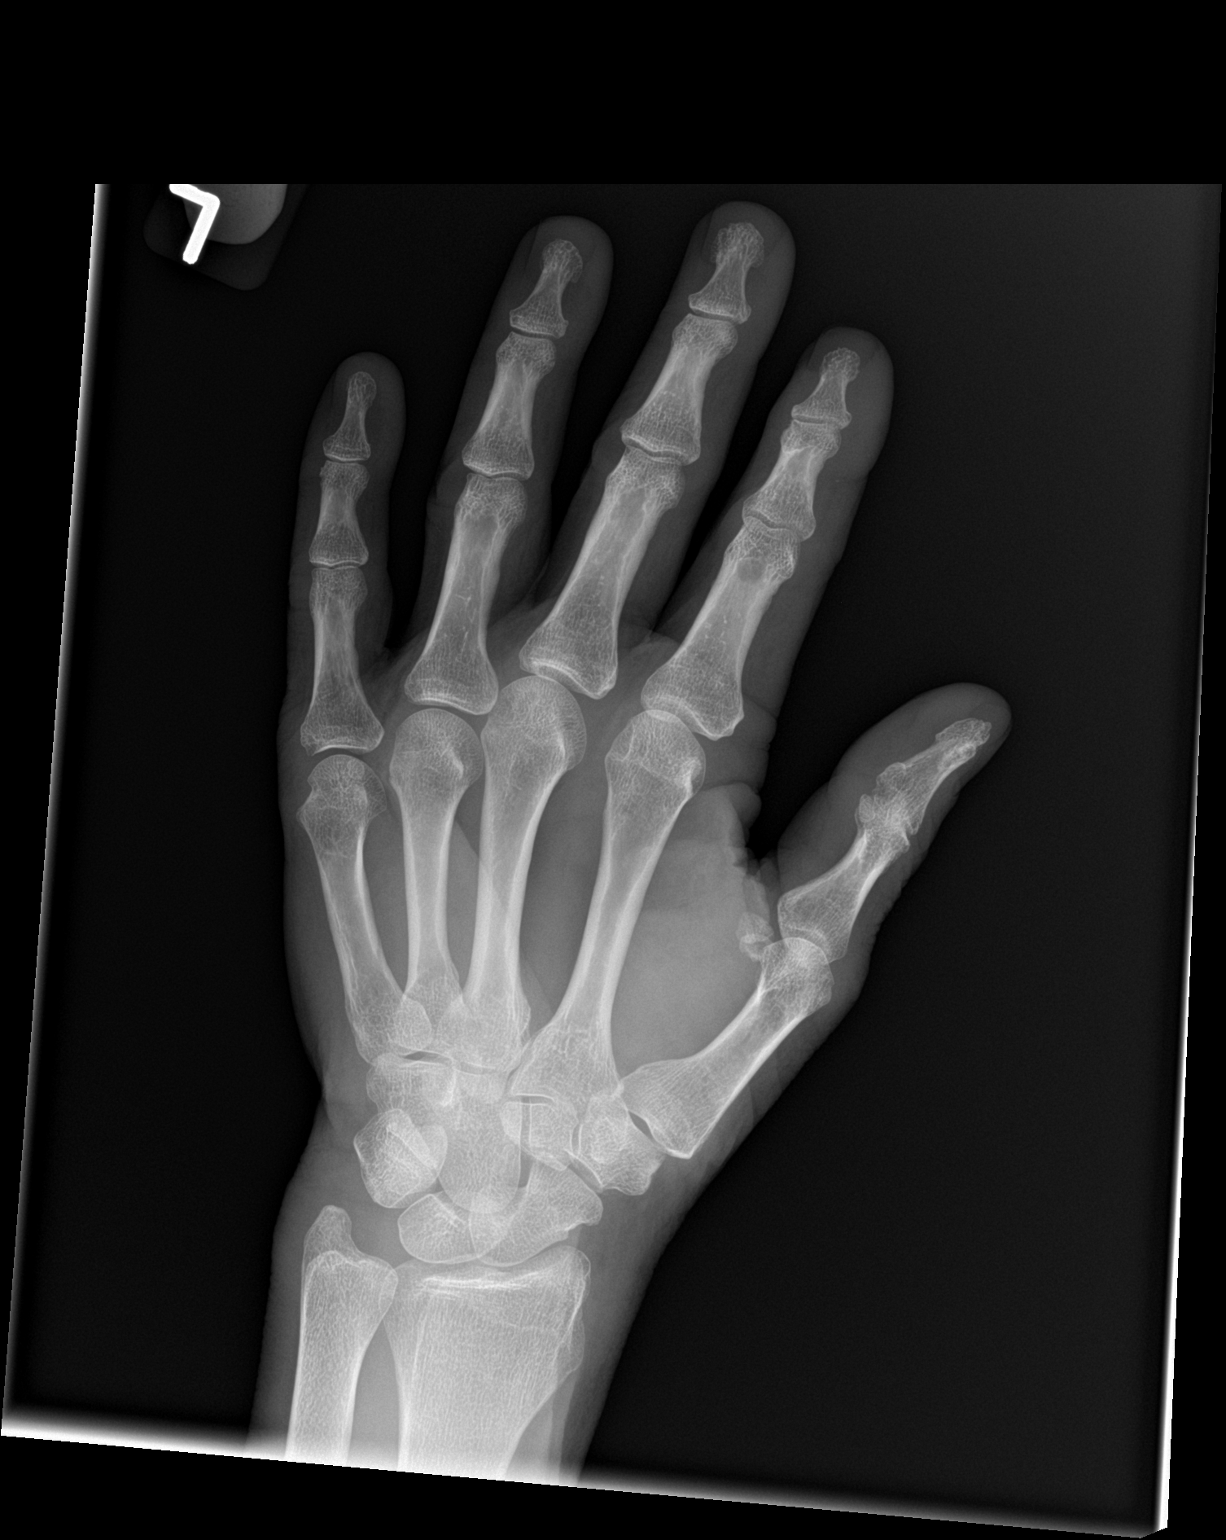

[hand lat]
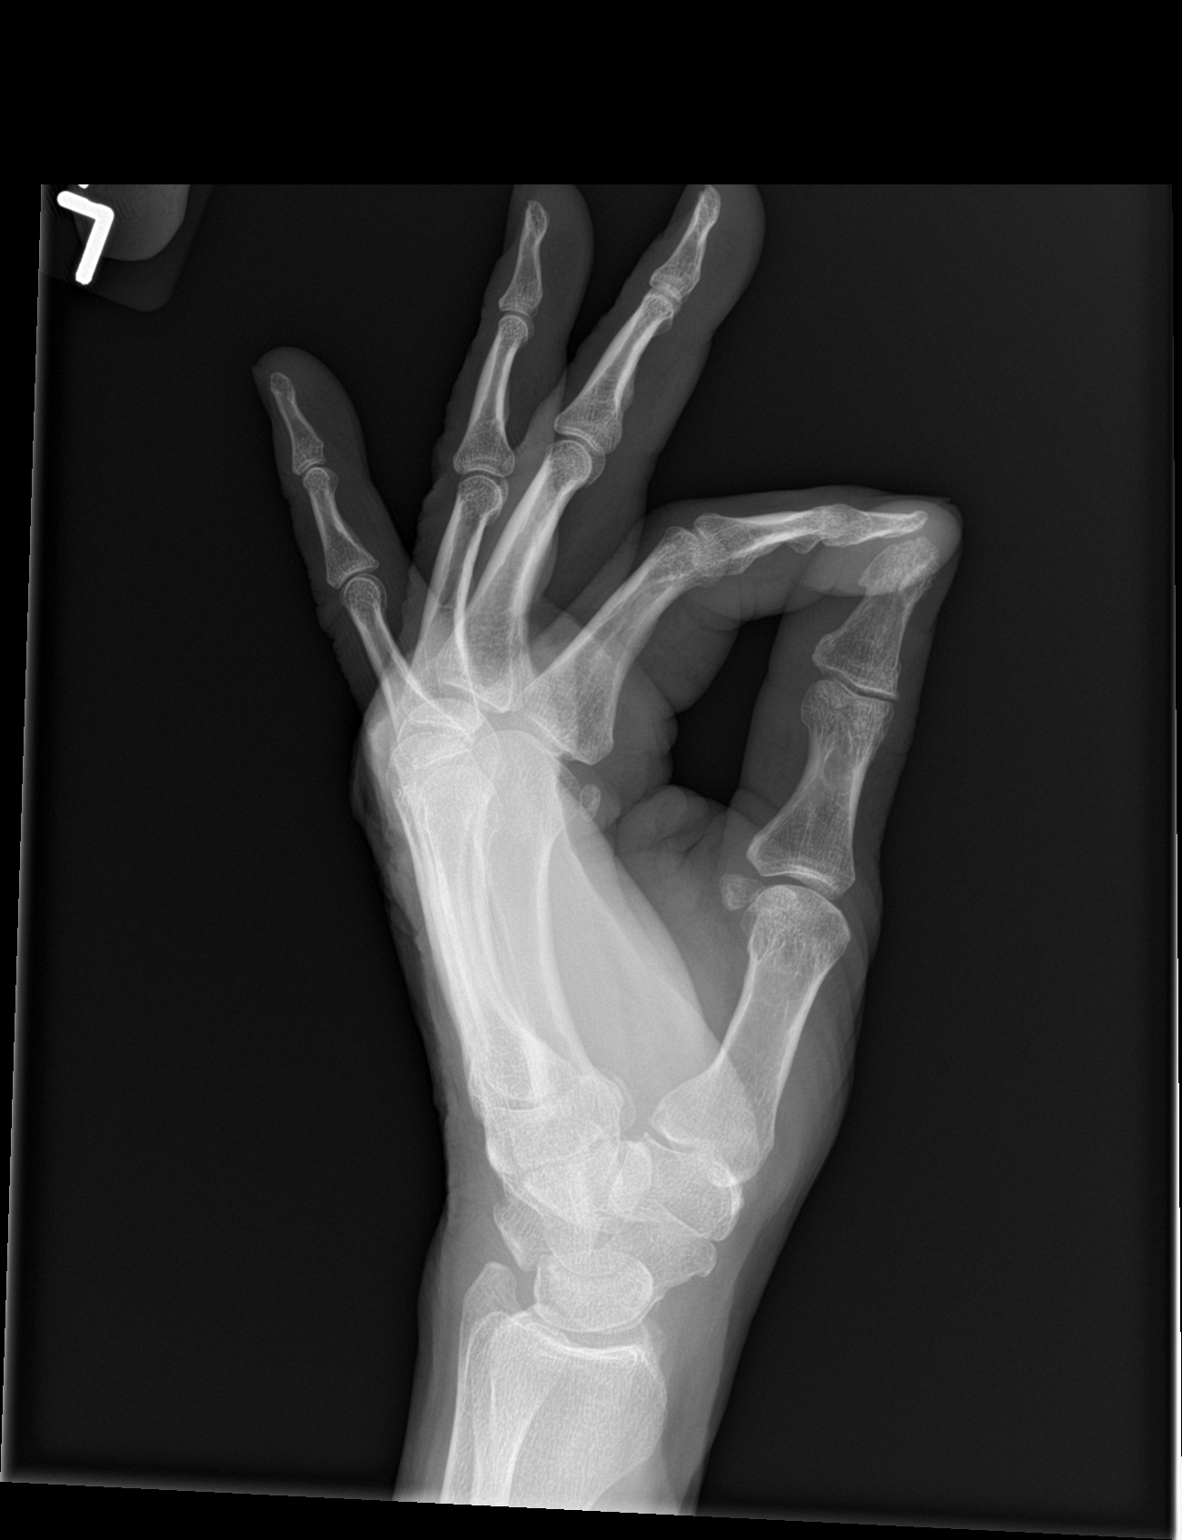

[3 of 3 positions shown; findings below may reference images not displayed]

FINDINGS: There is no evidence of fracture or dislocation. There is no
evidence of arthropathy or other focal bone abnormality. Soft
tissues are unremarkable.
IMPRESSION: Negative.
